# Patient Record
Sex: Female | Born: 1965 | Race: Black or African American | Marital: Single | Smoking: Current every day smoker
Health system: Southern US, Community
[De-identification: ages and names within clinical notes are randomized; demographics above are authoritative.]

## PROBLEM LIST (undated history)

## (undated) DIAGNOSIS — D219 Benign neoplasm of connective and other soft tissue, unspecified: Secondary | ICD-10-CM

## (undated) DIAGNOSIS — F329 Major depressive disorder, single episode, unspecified: Secondary | ICD-10-CM

## (undated) DIAGNOSIS — R011 Cardiac murmur, unspecified: Secondary | ICD-10-CM

## (undated) DIAGNOSIS — J45909 Unspecified asthma, uncomplicated: Secondary | ICD-10-CM

## (undated) DIAGNOSIS — F32A Depression, unspecified: Secondary | ICD-10-CM

## (undated) DIAGNOSIS — F319 Bipolar disorder, unspecified: Secondary | ICD-10-CM

## (undated) DIAGNOSIS — M26609 Unspecified temporomandibular joint disorder, unspecified side: Secondary | ICD-10-CM

## (undated) DIAGNOSIS — N809 Endometriosis, unspecified: Secondary | ICD-10-CM

## (undated) DIAGNOSIS — A6000 Herpesviral infection of urogenital system, unspecified: Secondary | ICD-10-CM

## (undated) HISTORY — DX: Major depressive disorder, single episode, unspecified: F32.9

## (undated) HISTORY — DX: Bipolar disorder, unspecified: F31.9

## (undated) HISTORY — DX: Depression, unspecified: F32.A

## (undated) HISTORY — DX: Herpesviral infection of urogenital system, unspecified: A60.00

## (undated) HISTORY — DX: Unspecified asthma, uncomplicated: J45.909

## (undated) HISTORY — DX: Unspecified temporomandibular joint disorder, unspecified side: M26.609

## (undated) HISTORY — DX: Benign neoplasm of connective and other soft tissue, unspecified: D21.9

## (undated) HISTORY — PX: OVARIAN CYST SURGERY: SHX726

## (undated) HISTORY — DX: Cardiac murmur, unspecified: R01.1

## (undated) HISTORY — DX: Endometriosis, unspecified: N80.9

---

## 1992-07-24 HISTORY — PX: TUBAL LIGATION: SHX77

## 2007-01-25 DIAGNOSIS — A6000 Herpesviral infection of urogenital system, unspecified: Secondary | ICD-10-CM

## 2007-01-25 HISTORY — DX: Herpesviral infection of urogenital system, unspecified: A60.00

## 2012-06-27 ENCOUNTER — Encounter: Payer: Self-pay | Admitting: Obstetrics

## 2012-06-27 ENCOUNTER — Ambulatory Visit (INDEPENDENT_AMBULATORY_CARE_PROVIDER_SITE_OTHER): Payer: Medicaid Other | Admitting: Obstetrics

## 2012-06-27 VITALS — BP 137/89 | HR 81 | Temp 98.9°F | Ht <= 58 in | Wt 168.0 lb

## 2012-06-27 DIAGNOSIS — N760 Acute vaginitis: Secondary | ICD-10-CM

## 2012-06-27 DIAGNOSIS — Z01419 Encounter for gynecological examination (general) (routine) without abnormal findings: Secondary | ICD-10-CM

## 2012-06-27 DIAGNOSIS — N949 Unspecified condition associated with female genital organs and menstrual cycle: Secondary | ICD-10-CM

## 2012-06-27 DIAGNOSIS — Z0000 Encounter for general adult medical examination without abnormal findings: Secondary | ICD-10-CM

## 2012-06-27 DIAGNOSIS — R52 Pain, unspecified: Secondary | ICD-10-CM | POA: Insufficient documentation

## 2012-06-27 DIAGNOSIS — Z113 Encounter for screening for infections with a predominantly sexual mode of transmission: Secondary | ICD-10-CM

## 2012-06-27 DIAGNOSIS — F329 Major depressive disorder, single episode, unspecified: Secondary | ICD-10-CM | POA: Insufficient documentation

## 2012-06-27 LAB — COMPREHENSIVE METABOLIC PANEL
CO2: 25 mEq/L (ref 19–32)
Calcium: 9.3 mg/dL (ref 8.4–10.5)
Chloride: 104 mEq/L (ref 96–112)
Creat: 0.64 mg/dL (ref 0.50–1.10)
Glucose, Bld: 80 mg/dL (ref 70–99)
Total Bilirubin: 0.5 mg/dL (ref 0.3–1.2)

## 2012-06-27 LAB — DIFFERENTIAL
Eosinophils Absolute: 0 10*3/uL (ref 0.0–0.7)
Eosinophils Relative: 0 % (ref 0–5)
Lymphocytes Relative: 44 % (ref 12–46)
Lymphs Abs: 3.4 10*3/uL (ref 0.7–4.0)
Monocytes Absolute: 0.5 10*3/uL (ref 0.1–1.0)
Monocytes Relative: 6 % (ref 3–12)

## 2012-06-27 LAB — CBC
HCT: 38.5 % (ref 36.0–46.0)
Hemoglobin: 12.4 g/dL (ref 12.0–15.0)
MCH: 28.5 pg (ref 26.0–34.0)
MCV: 88.5 fL (ref 78.0–100.0)
RBC: 4.35 MIL/uL (ref 3.87–5.11)

## 2012-06-27 LAB — TSH: TSH: 0.694 u[IU]/mL (ref 0.350–4.500)

## 2012-06-27 NOTE — Addendum Note (Signed)
Addended by: Julaine Hua on: 06/27/2012 05:44 PM   Modules accepted: Orders

## 2012-06-27 NOTE — Progress Notes (Signed)
Subjective:     Mariah Meyer is a 47 y.o. female here for a routine exam.  Current complaints: lower abdominal pain.     Gynecologic History Patient's last menstrual period was 06/06/2012. Contraception: tubal ligation Last Pap: 08/2010. Results were: normal Last mammogram: n/a. Results were: n/a    The following portions of the patient's history were reviewed and updated as appropriate: allergies, current medications, past family history, past medical history, past social history, past surgical history and problem list.  Review of Systems Pertinent items are noted in HPI.    Objective:    General appearance: alert and no distress Breasts: normal appearance, no masses or tenderness Abdomen: normal findings: soft, non-tender Pelvic: cervix normal in appearance, external genitalia normal, no adnexal masses or tenderness, no cervical motion tenderness, uterus normal size, shape, and consistency and vagina normal without discharge    Assessment:      Chronic pain syndrome.  Doubt endometriosis.  Referred to IM  Mammogram and pelvic U/S ordered.  STD Panel and General Health Panel ordered.   Plan:    Education reviewed: depression evaluation, low fat, low cholesterol diet, safe sex/STD prevention, self breast exams and chronic pain..   F/U in 2 weeks.

## 2012-06-28 LAB — GC/CHLAMYDIA PROBE AMP: CT Probe RNA: NEGATIVE

## 2012-06-28 LAB — HEPATITIS B SURFACE ANTIGEN: Hepatitis B Surface Ag: NEGATIVE

## 2012-06-28 LAB — HIV ANTIBODY (ROUTINE TESTING W REFLEX): HIV: NONREACTIVE

## 2012-06-28 LAB — HEPATITIS C ANTIBODY: HCV Ab: NEGATIVE

## 2012-06-28 LAB — WET PREP BY MOLECULAR PROBE
Candida species: NEGATIVE
Gardnerella vaginalis: POSITIVE — AB

## 2012-07-03 ENCOUNTER — Telehealth: Payer: Self-pay | Admitting: *Deleted

## 2012-07-03 ENCOUNTER — Ambulatory Visit (HOSPITAL_COMMUNITY)
Admission: RE | Admit: 2012-07-03 | Discharge: 2012-07-03 | Disposition: A | Discharge status: Home / Self Care | Payer: Medicaid Other | Source: Ambulatory Visit | Attending: Obstetrics | Admitting: Obstetrics

## 2012-07-03 DIAGNOSIS — Z113 Encounter for screening for infections with a predominantly sexual mode of transmission: Secondary | ICD-10-CM

## 2012-07-03 DIAGNOSIS — Z1231 Encounter for screening mammogram for malignant neoplasm of breast: Secondary | ICD-10-CM

## 2012-07-03 MED ORDER — METRONIDAZOLE 500 MG PO TABS: ORAL_TABLET | ORAL | Status: AC

## 2012-07-03 NOTE — Telephone Encounter (Signed)
Patient returning phone call regarding lab results. She was informed per Dr Clearance Coots of Rx for Flagyl 2 grams p o all at once, then Flagyl 500mg  p o bid x 7 days. Patient verbalized understanding and agrees as instructed.

## 2012-07-04 ENCOUNTER — Other Ambulatory Visit: Payer: Self-pay | Admitting: Obstetrics

## 2012-07-04 ENCOUNTER — Ambulatory Visit (INDEPENDENT_AMBULATORY_CARE_PROVIDER_SITE_OTHER): Payer: Medicaid Other

## 2012-07-04 DIAGNOSIS — N949 Unspecified condition associated with female genital organs and menstrual cycle: Secondary | ICD-10-CM

## 2012-07-16 ENCOUNTER — Encounter: Payer: Self-pay | Admitting: Obstetrics

## 2012-07-17 ENCOUNTER — Encounter: Payer: Self-pay | Admitting: Obstetrics

## 2012-07-17 ENCOUNTER — Ambulatory Visit (INDEPENDENT_AMBULATORY_CARE_PROVIDER_SITE_OTHER): Payer: Medicaid Other | Admitting: Obstetrics

## 2012-07-17 VITALS — BP 101/67 | HR 62 | Temp 98.0°F | Wt 167.8 lb

## 2012-07-17 DIAGNOSIS — B9689 Other specified bacterial agents as the cause of diseases classified elsewhere: Secondary | ICD-10-CM

## 2012-07-17 DIAGNOSIS — N760 Acute vaginitis: Secondary | ICD-10-CM

## 2012-07-17 DIAGNOSIS — A499 Bacterial infection, unspecified: Secondary | ICD-10-CM

## 2012-07-17 MED ORDER — METRONIDAZOLE 0.75 % VA GEL: 1.0000 | Freq: Two times a day (BID) | VAGINAL | Status: DC

## 2012-07-17 NOTE — Progress Notes (Signed)
Subjective:     Mariah Meyer is a 47 y.o. female here for a follow up to discuss ultrasound results.  Current complaints: pelvic pain.  Personal health questionnaire reviewed: not asked.   Gynecologic History Patient's last menstrual period was 07/07/2012. Contraception: not asked    The following portions of the patient's history were reviewed and updated as appropriate: allergies, current medications, past family history, past medical history, past social history, past surgical history and problem list.  Review of Systems Pertinent items are noted in HPI.    Objective:    General appearance: alert and no distress    No exam done.  Consult only.  Assessment:    Pelvic pain.  Ultrasound WNL's.  Pain probably not Gyn source.     Plan:    Education reviewed: Pelvic pain. F/U prn.

## 2012-07-18 ENCOUNTER — Ambulatory Visit: Payer: Medicaid Other | Admitting: Obstetrics

## 2012-11-02 ENCOUNTER — Ambulatory Visit: Payer: Medicaid Other | Attending: Internal Medicine | Admitting: Internal Medicine

## 2012-11-02 VITALS — BP 126/89 | HR 72 | Temp 97.9°F | Resp 18 | Wt 174.0 lb

## 2012-11-02 DIAGNOSIS — R42 Dizziness and giddiness: Secondary | ICD-10-CM

## 2012-11-02 DIAGNOSIS — R51 Headache: Secondary | ICD-10-CM | POA: Insufficient documentation

## 2012-11-02 DIAGNOSIS — Z09 Encounter for follow-up examination after completed treatment for conditions other than malignant neoplasm: Secondary | ICD-10-CM | POA: Insufficient documentation

## 2012-11-02 MED ORDER — ETODOLAC 500 MG PO TABS: 500.0000 mg | ORAL_TABLET | Freq: Two times a day (BID) | ORAL | Status: AC

## 2012-11-02 MED ORDER — DULOXETINE HCL 30 MG PO CPEP: 30.0000 mg | ORAL_CAPSULE | Freq: Every day | ORAL | Status: DC

## 2012-11-02 MED ORDER — ARIPIPRAZOLE 5 MG PO TABS: 5.0000 mg | ORAL_TABLET | Freq: Every day | ORAL | Status: AC

## 2012-11-02 MED ORDER — ALBUTEROL SULFATE HFA 108 (90 BASE) MCG/ACT IN AERS: 2.0000 | INHALATION_SPRAY | Freq: Four times a day (QID) | RESPIRATORY_TRACT | Status: DC | PRN

## 2012-11-02 MED ORDER — LORAZEPAM 1 MG PO TABS: 1.0000 mg | ORAL_TABLET | Freq: Two times a day (BID) | ORAL | Status: DC | PRN

## 2012-11-02 MED ORDER — FERROUS SULFATE CR 160 (50 FE) MG PO TBCR: 160.0000 mg | EXTENDED_RELEASE_TABLET | Freq: Two times a day (BID) | ORAL | Status: DC

## 2012-11-02 MED ORDER — OXYCODONE-ACETAMINOPHEN 5-325 MG PO TABS: 1.0000 | ORAL_TABLET | Freq: Three times a day (TID) | ORAL | Status: DC | PRN

## 2012-11-02 MED ORDER — FLUTICASONE PROPIONATE HFA 110 MCG/ACT IN AERO: 1.0000 | INHALATION_SPRAY | Freq: Two times a day (BID) | RESPIRATORY_TRACT | Status: DC

## 2012-11-02 NOTE — Patient Instructions (Signed)
Neuropathy Neuropathy means your peripheral nerves are not working normally. Peripheral nerves are the nerves outside the brain and spinal cord. Messages between the brain and the rest of the body do not work properly with peripheral nerve disorders. CAUSES There are many different causes of peripheral nerve disorders. These include:  Injury.   Infections.   Diabetes.   Vitamin deficiency.   Poor circulation.   Alcoholism.   Exposure to toxins.   Drug effects.   Tumors.   Kidney disease.  SYMPTOMS  Tingling, burning, pain, and numbness in the extremities.   Weakness and loss of muscle tone and size.  DIAGNOSIS Blood tests and special studies of nerve function may help confirm the diagnosis.  TREATMENT  Treatment includes adopting healthy life habits.   A good diet, vitamin supplements, and mild pain medicine may be needed.   Avoid known toxins such as alcohol, tobacco, and recreational drugs.   Anti-convulsant medicines are helpful in some types of neuropathy.  Make a follow-up appointment with your caregiver to be sure you are getting better with treatment.  SEEK IMMEDIATE MEDICAL CARE IF:   You have breathing problems.   You have severe or uncontrolled pain.   You notice extreme weakness or you feel faint.   You are not better after 1 week or if you have worse symptoms.  Document Released: 02/18/2004 Document Revised: 09/22/2010 Document Reviewed: 01/10/2005 [employer] Patient Information 2012 [employer]. 

## 2012-11-02 NOTE — Progress Notes (Signed)
Patient here to establish care History of asthma Depression Back problems

## 2012-11-02 NOTE — Progress Notes (Signed)
Patient ID: Mariah Meyer, female   DOB: 03-Oct-1965, 47 y.o.   MRN: [id number]  CC:  Followup and refill of medications   HPI: Patient is 47 year old female who presents to clinic with multiple concerns including chronic headaches, muscle spasms that are intermittent and worse at night associated with muscle cramping, lower back pain that striving and constant, 5/10 in severity, radiating to left lower extremity associated with toe cramping and inability to ambulate on certain occasions. She explains her symptoms get worse with menstrual cycle and improve 1 weeks post menstrual cycle. She has had symptoms for over several years and is not sure where it's all coming from. She denies fevers and chills, no specific focal neurological deficits, no weight loss or weight gain, no night sweats.   Allergies  Allergen Reactions  . Percocet [Oxycodone-Acetaminophen] Itching  . Risperidone And Related     Lactation   . Sulfa Antibiotics Itching   Past Medical History  Diagnosis Date  . Asthma   . Endometriosis   . Depression   . Fibroid   . TMJ (temporomandibular joint disorder)   . Genital herpes 2009  . Heart murmur   . Bipolar disorder, unspecified    Current Outpatient Prescriptions on File Prior to Visit  Medication Sig Dispense Refill  . clotrimazole-betamethasone (LOTRISONE) cream Apply 1 application topically 2 (two) times daily.      . metroNIDAZOLE (METROGEL VAGINAL) 0.75 % vaginal gel Place 1 Applicatorful vaginally 2 (two) times daily.  70 g  2  . omeprazole (PRILOSEC) 40 MG capsule Take 40 mg by mouth daily.      . potassium chloride (K-DUR) 10 MEQ tablet Take 10 mEq by mouth 2 (two) times daily.      Marland Kitchen tiZANidine (ZANAFLEX) 4 MG capsule Take 4 mg by mouth 3 (three) times daily as needed for muscle spasms.       No current facility-administered medications on file prior to visit.   Family History  Problem Relation Age of Onset  . Alcoholism Mother   . Arthritis    . Asthma Mother    . Cancer    . Chronic bronchitis    . Diabetes    . Emphysema    . HIV Mother   . Depression Mother   . Migraines    . Breast cancer Maternal Aunt    History   Social History  . Marital Status: Single    Spouse Name: N/A    Number of Children: 4  . Years of Education: N/A   Occupational History  . Not on file.   Social History Main Topics  . Smoking status: Current Every Day Smoker -- 0.50 packs/day    Types: Cigarettes  . Smokeless tobacco: Never Used  . Alcohol Use: Yes     Comment: daily  . Drug Use: Yes    Special: Marijuana     Comment: "daily to eat"  . Sexual Activity: Yes    Partners: Female    Pharmacist, hospital Protection: Surgical   Other Topics Concern  . Not on file   Social History Narrative  . No narrative on file    Review of Systems  Constitutional: Negative for fever, chills, diaphoresis, activity change, appetite change and fatigue.  HENT: Negative for ear pain, nosebleeds, congestion, facial swelling, rhinorrhea, neck pain, neck stiffness and ear discharge.   Eyes: Negative for pain, discharge, redness, itching and visual disturbance.  Respiratory: Negative for cough, choking, chest tightness, shortness of breath, wheezing and stridor.  Cardiovascular: Negative for chest pain, palpitations and leg swelling.  Gastrointestinal: Negative for abdominal distention.  Genitourinary: Negative for dysuria, urgency, frequency, hematuria, flank pain, decreased urine volume, difficulty urinating and dyspareunia.  Musculoskeletal: per history of present illness  Neurological: per history of present illness  Hematological: Negative for adenopathy. Does not bruise/bleed easily.  Psychiatric/Behavioral: Negative for hallucinations, behavioral problems, confusion, dysphoric mood, decreased concentration and agitation.    Objective:   Filed Vitals:   11/02/12 0907  BP: 126/89  Pulse: 72  Temp: 97.9 F (36.6 C)  Resp: 18    Physical Exam   Constitutional: Appears well-developed and well-nourished. No distress.  HENT: Normocephalic. External right and left ear normal. Oropharynx is clear and moist.  Eyes: Conjunctivae and EOM are normal. PERRLA, no scleral icterus.  Neck: Normal ROM. Neck supple. No JVD. No tracheal deviation. No thyromegaly.  CVS: RRR, S1/S2 +, no murmurs, no gallops, no carotid bruit.  Pulmonary: Effort and breath sounds normal, no stridor, rhonchi, wheezes, rales.  Abdominal: Soft. BS +,  no distension, tenderness, rebound or guarding.  Musculoskeletal: Normal range of motion. No edema and no tenderness.  Lymphadenopathy: No lymphadenopathy noted, cervical, inguinal. Neuro: Alert. Normal reflexes, muscle tone coordination. No cranial nerve deficit. Skin: Skin is warm and dry. No rash noted. Not diaphoretic. No erythema. No pallor.  Psychiatric: Normal mood and affect. Behavior, judgment, thought content normal.   Lab Results  Component Value Date   WBC 7.8 06/27/2012   HGB 12.4 06/27/2012   HCT 38.5 06/27/2012   MCV 88.5 06/27/2012   PLT 288 06/27/2012   Lab Results  Component Value Date   CREATININE 0.64 06/27/2012   BUN 9 06/27/2012   NA 139 06/27/2012   K 3.4* 06/27/2012   CL 104 06/27/2012   CO2 25 06/27/2012    No results found for this basename: HGBA1C   Lipid Panel  No results found for this basename: chol, trig, hdl, cholhdl, vldl, ldlcalc       Assessment and plan:   Patient Active Problem List   Diagnosis Date Noted  . Pain - continuing analgesia, encouraged physical therapy. Also encouraged applying heating pads to the areas affected  06/27/2012  .  muscle spasm, chronic headaches, dizziness - patient experiencing wide variety of symptoms over a long period of time and it is difficult to continue to several different diagnosis to explain the symptoms. We have discussed other diagnoses including possibility of multiple sclerosis. Patient did acknowledge that several family members were affected by MS  but she is not sure of the exact diagnosis. I will provide referral to neurology for further evaluation.  06/27/2012

## 2012-11-05 ENCOUNTER — Telehealth: Payer: Self-pay | Admitting: Internal Medicine

## 2012-11-05 NOTE — Telephone Encounter (Signed)
Pt calling with medication effect update: LORazepam (ATIVAN) 1 MG tablet is helping with numbness in face and hand/foot but not helping with menstrual cramps or pain.  Please f/u with pt.

## 2012-11-05 NOTE — Telephone Encounter (Signed)
Spoke with pt and told her to try OTC Pamprin for cramping if not resolved call to schedule office visit.

## 2012-11-07 ENCOUNTER — Telehealth: Payer: Self-pay | Admitting: Emergency Medicine

## 2012-11-16 ENCOUNTER — Ambulatory Visit: Payer: Medicaid Other | Attending: Internal Medicine | Admitting: Internal Medicine

## 2012-11-16 ENCOUNTER — Encounter: Payer: Self-pay | Admitting: Internal Medicine

## 2012-11-16 VITALS — BP 127/79 | HR 60 | Temp 98.1°F | Resp 16 | Wt 178.0 lb

## 2012-11-16 DIAGNOSIS — F329 Major depressive disorder, single episode, unspecified: Secondary | ICD-10-CM

## 2012-11-16 DIAGNOSIS — F411 Generalized anxiety disorder: Secondary | ICD-10-CM | POA: Insufficient documentation

## 2012-11-16 DIAGNOSIS — R52 Pain, unspecified: Secondary | ICD-10-CM

## 2012-11-16 MED ORDER — OMEPRAZOLE 40 MG PO CPDR: 40.0000 mg | DELAYED_RELEASE_CAPSULE | Freq: Every day | ORAL | Status: DC

## 2012-11-16 MED ORDER — OXYCODONE-ACETAMINOPHEN 5-325 MG PO TABS: 1.0000 | ORAL_TABLET | Freq: Three times a day (TID) | ORAL | Status: DC | PRN

## 2012-11-16 MED ORDER — FERROUS SULFATE CR 160 (50 FE) MG PO TBCR: 160.0000 mg | EXTENDED_RELEASE_TABLET | Freq: Two times a day (BID) | ORAL | Status: AC

## 2012-11-16 MED ORDER — TRIAMCINOLONE ACETONIDE 0.1 % EX CREA: TOPICAL_CREAM | Freq: Two times a day (BID) | CUTANEOUS | Status: AC

## 2012-11-16 MED ORDER — POTASSIUM CHLORIDE ER 10 MEQ PO TBCR: 10.0000 meq | EXTENDED_RELEASE_TABLET | Freq: Two times a day (BID) | ORAL | Status: DC

## 2012-11-16 MED ORDER — LORAZEPAM 1 MG PO TABS: 1.0000 mg | ORAL_TABLET | Freq: Two times a day (BID) | ORAL | Status: AC | PRN

## 2012-11-16 NOTE — Progress Notes (Signed)
Pt is here for a f/u and needing refills on meds Voices no new concerns Alert w/no signs of acute distress.

## 2012-11-16 NOTE — Progress Notes (Signed)
Patient ID: Mariah Meyer, female   DOB: [birth year], 47 y.o.   MRN: [id number]  CC: Followup  HPI: 47 year old female with past medical history of anxiety, depression, neuropathy who presented to clinic for followup. Patient reports that her tingling in extremities was significantly relieved with Ativan. Patient has no chest pain, shortness of breath.  Allergies  Allergen Reactions  . Percocet [Oxycodone-Acetaminophen] Itching  . Risperidone And Related     Lactation   . Sulfa Antibiotics Itching   Past Medical History  Diagnosis Date  . Asthma   . Endometriosis   . Depression   . Fibroid   . TMJ (temporomandibular joint disorder)   . Genital herpes 2009  . Heart murmur   . Bipolar disorder, unspecified    Current Outpatient Prescriptions on File Prior to Visit  Medication Sig Dispense Refill  . albuterol (PROVENTIL HFA;VENTOLIN HFA) 108 (90 BASE) MCG/ACT inhaler Inhale 2 puffs into the lungs every 6 (six) hours as needed for wheezing.  1 Inhaler  11  . cholecalciferol (VITAMIN D) 1000 UNITS tablet Take 1,000 Units by mouth daily.      . clotrimazole-betamethasone (LOTRISONE) cream Apply 1 application topically 2 (two) times daily.      . fluticasone (FLOVENT HFA) 110 MCG/ACT inhaler Inhale 1 puff into the lungs 2 (two) times daily.  1 Inhaler  5  . tiZANidine (ZANAFLEX) 4 MG capsule Take 4 mg by mouth 3 (three) times daily as needed for muscle spasms.      . ARIPiprazole (ABILIFY) 5 MG tablet Take 1 tablet (5 mg total) by mouth daily.  30 tablet  1  . etodolac (LODINE) 500 MG tablet Take 1 tablet (500 mg total) by mouth 2 (two) times daily.  60 tablet  3   No current facility-administered medications on file prior to visit.   Family History  Problem Relation Age of Onset  . Alcoholism Mother   . Arthritis    . Asthma Mother   . Cancer    . Chronic bronchitis    . Diabetes    . Emphysema    . HIV Mother   . Depression Mother   . Migraines    . Breast cancer Maternal Aunt     History   Social History  . Marital Status: Single    Spouse Name: N/A    Number of Children: 4  . Years of Education: N/A   Occupational History  . Not on file.   Social History Main Topics  . Smoking status: Current Every Day Smoker -- 0.50 packs/day    Types: Cigarettes  . Smokeless tobacco: Never Used  . Alcohol Use: Yes     Comment: daily  . Drug Use: Yes    Special: Marijuana     Comment: "daily to eat"  . Sexual Activity: Yes    Partners: Female    Pharmacist, hospital Protection: Surgical   Other Topics Concern  . Not on file   Social History Narrative  . No narrative on file    Review of Systems  Constitutional: Negative for fever, chills, diaphoresis, activity change, appetite change and fatigue.  HENT: Negative for ear pain, nosebleeds, congestion, facial swelling, rhinorrhea, neck pain, neck stiffness and ear discharge.   Eyes: Negative for pain, discharge, redness, itching and visual disturbance.  Respiratory: Negative for cough, choking, chest tightness, shortness of breath, wheezing and stridor.   Cardiovascular: Negative for chest pain, palpitations and leg swelling.  Gastrointestinal: Negative for abdominal distention.  Genitourinary: Negative  for dysuria, urgency, frequency, hematuria, flank pain, decreased urine volume, difficulty urinating and dyspareunia.  Musculoskeletal: Negative for back pain, joint swelling, arthralgias and gait problem.  Neurological: Negative for dizziness, tremors, seizures, syncope, facial asymmetry, speech difficulty, weakness, light-headedness, numbness and headaches.  Hematological: Negative for adenopathy. Does not bruise/bleed easily.  Psychiatric/Behavioral: Negative for hallucinations, behavioral problems, confusion, dysphoric mood, decreased concentration and agitation.    Objective:   Filed Vitals:   11/16/12 1116  BP: 127/79  Pulse: 60  Temp: 98.1 F (36.7 C)  Resp: 16    Physical Exam  Constitutional:  Appears well-developed and well-nourished. No distress.  HENT: Normocephalic. External right and left ear normal. Oropharynx is clear and moist.  Eyes: Conjunctivae and EOM are normal. PERRLA, no scleral icterus.  Neck: Normal ROM. Neck supple. No JVD. No tracheal deviation. No thyromegaly.  CVS: RRR, S1/S2 +, no murmurs, no gallops, no carotid bruit.  Pulmonary: Effort and breath sounds normal, no stridor, rhonchi, wheezes, rales.  Abdominal: Soft. BS +,  no distension, tenderness, rebound or guarding.  Musculoskeletal: Normal range of motion. No edema and no tenderness.  Lymphadenopathy: No lymphadenopathy noted, cervical, inguinal. Neuro: Alert. Normal reflexes, muscle tone coordination. No cranial nerve deficit. Skin: Skin is warm and dry. No rash noted. Not diaphoretic. No erythema. No pallor.  Psychiatric: Normal mood and affect. Behavior, judgment, thought content normal.   Lab Results  Component Value Date   WBC 7.8 06/27/2012   HGB 12.4 06/27/2012   HCT 38.5 06/27/2012   MCV 88.5 06/27/2012   PLT 288 06/27/2012   Lab Results  Component Value Date   CREATININE 0.64 06/27/2012   BUN 9 06/27/2012   NA 139 06/27/2012   K 3.4* 06/27/2012   CL 104 06/27/2012   CO2 25 06/27/2012    No results found for this basename: HGBA1C   Lipid Panel  No results found for this basename: chol, trig, hdl, cholhdl, vldl, ldlcalc       Assessment and plan:   Patient Active Problem List   Diagnosis Date Noted  . Anxiety state, unspecified 11/16/2012    Priority: Medium - Continue Ativan 1 mg every 8 hours as needed   . Pain 06/27/2012    Priority: Medium - Continue Roxicet as needed every 4-6 hours. Referral provided to pain management clinic   . Depression 06/27/2012    Priority: Medium - Continue Abilify

## 2012-11-16 NOTE — Patient Instructions (Signed)
Anxiety and Panic Attacks Your caregiver has informed you that you are having an anxiety or panic attack. There may be many forms of this. Most of the time these attacks come suddenly and without warning. They come at any time of day, including periods of sleep, and at any time of life. They may be strong and unexplained. Although panic attacks are very scary, they are physically harmless. Sometimes the cause of your anxiety is not known. Anxiety is a protective mechanism of the body in its fight or flight mechanism. Most of these perceived danger situations are actually nonphysical situations (such as anxiety over losing a job). CAUSES  The causes of an anxiety or panic attack are many. Panic attacks may occur in otherwise healthy people given a certain set of circumstances. There may be a genetic cause for panic attacks. Some medications may also have anxiety as a side effect. SYMPTOMS  Some of the most common feelings are:  Intense terror.  Dizziness, feeling faint.  Hot and cold flashes.  Fear of going crazy.  Feelings that nothing is real.  Sweating.  Shaking.  Chest pain or a fast heartbeat (palpitations).  Smothering, choking sensations.  Feelings of impending doom and that death is near.  Tingling of extremities, this may be from over-breathing.  Altered reality (derealization).  Being detached from yourself (depersonalization). Several symptoms can be present to make up anxiety or panic attacks. DIAGNOSIS  The evaluation by your caregiver will depend on the type of symptoms you are experiencing. The diagnosis of anxiety or panic attack is made when no physical illness can be determined to be a cause of the symptoms. TREATMENT  Treatment to prevent anxiety and panic attacks may include:  Avoidance of circumstances that cause anxiety.  Reassurance and relaxation.  Regular exercise.  Relaxation therapies, such as yoga.  Psychotherapy with a psychiatrist or  therapist.  Avoidance of caffeine, alcohol and illegal drugs.  Prescribed medication. SEEK IMMEDIATE MEDICAL CARE IF:   You experience panic attack symptoms that are different than your usual symptoms.  You have any worsening or concerning symptoms. Document Released: 01/10/2005 Document Revised: 04/04/2011 Document Reviewed: 05/14/2009 [facility] Patient Information 2014 [place], [place]. Depression, Adult Depression refers to feeling sad, low, down in the dumps, blue, gloomy, or empty. In general, there are two kinds of depression: 1. Depression that we all experience from time to time because of upsetting life experiences, including the loss of a job or the ending of a relationship (normal sadness or normal grief). This kind of depression is considered normal, is short lived, and resolves within a few days to 2 weeks. (Depression experienced after the loss of a loved one is called bereavement. Bereavement often lasts longer than 2 weeks but normally gets better with time.) 2. Clinical depression, which lasts longer than normal sadness or normal grief or interferes with your ability to function at home, at work, and in school. It also interferes with your personal relationships. It affects almost every aspect of your life. Clinical depression is an illness. Symptoms of depression also can be caused by conditions other than normal sadness and grief or clinical depression. Examples of these conditions are listed as follows:  Physical illness Some physical illnesses, including underactive thyroid gland (hypothyroidism), severe anemia, specific types of cancer, diabetes, uncontrolled seizures, heart and lung problems, strokes, and chronic pain are commonly associated with symptoms of depression.  Side effects of some prescription medicine In some people, certain types of prescription medicine can cause symptoms  of depression.  Substance abuse Abuse of alcohol and illicit drugs can cause symptoms of  depression. SYMPTOMS Symptoms of normal sadness and normal grief include the following:  Feeling sad or crying for short periods of time.  Not caring about anything (apathy).  Difficulty sleeping or sleeping too much.  No longer able to enjoy the things you used to enjoy.  Desire to be by oneself all the time (social isolation).  Lack of energy or motivation.  Difficulty concentrating or remembering.  Change in appetite or weight.  Restlessness or agitation. Symptoms of clinical depression include the same symptoms of normal sadness or normal grief and also the following symptoms:  Feeling sad or crying all the time.  Feelings of guilt or worthlessness.  Feelings of hopelessness or helplessness.  Thoughts of suicide or the desire to harm yourself (suicidal ideation).  Loss of touch with reality (psychotic symptoms). Seeing or hearing things that are not real (hallucinations) or having false beliefs about your life or the people around you (delusions and paranoia). DIAGNOSIS  The diagnosis of clinical depression usually is based on the severity and duration of the symptoms. Your caregiver also will ask you questions about your medical history and substance use to find out if physical illness, use of prescription medicine, or substance abuse is causing your depression. Your caregiver also may order blood tests. TREATMENT  Typically, normal sadness and normal grief do not require treatment. However, sometimes antidepressant medicine is prescribed for bereavement to ease the depressive symptoms until they resolve. The treatment for clinical depression depends on the severity of your symptoms but typically includes antidepressant medicine, counseling with a mental health professional, or a combination of both. Your caregiver will help to determine what treatment is best for you. Depression caused by physical illness usually goes away with appropriate medical treatment of the illness.  If prescription medicine is causing depression, talk with your caregiver about stopping the medicine, decreasing the dose, or substituting another medicine. Depression caused by abuse of alcohol or illicit drugs abuse goes away with abstinence from these substances. Some adults need professional help in order to stop drinking or using drugs. SEEK IMMEDIATE CARE IF:  You have thoughts about hurting yourself or others.  You lose touch with reality (have psychotic symptoms).  You are taking medicine for depression and have a serious side effect. FOR MORE INFORMATION National [employer] on Mental Illness: www.nami.[employer]: http://www.maynard.net/ Document Released: 01/08/2000 Document Revised: 07/12/2011 Document Reviewed: 04/11/2011 [facility] Patient Information 2014 [place], [place].

## 2012-11-20 ENCOUNTER — Telehealth: Payer: Self-pay | Admitting: Internal Medicine

## 2012-11-20 NOTE — Telephone Encounter (Signed)
Pt not having tingling in left leg but now left big toe hurts, especially at night.  At night whole leg and foot get really cold, left big toe is numb to touch.   Pt is worried and says it is uncomfortable at work.  Please f/u with pt.

## 2012-11-20 NOTE — Telephone Encounter (Signed)
Pt given appt to come in tomorrow morning for labs per Dr. Izola Price

## 2012-11-21 ENCOUNTER — Ambulatory Visit: Payer: Medicaid Other | Attending: Internal Medicine

## 2012-11-21 VITALS — BP 124/82 | HR 65 | Temp 98.5°F | Resp 16

## 2012-11-21 DIAGNOSIS — Z299 Encounter for prophylactic measures, unspecified: Secondary | ICD-10-CM

## 2012-11-21 DIAGNOSIS — M79609 Pain in unspecified limb: Secondary | ICD-10-CM | POA: Insufficient documentation

## 2012-11-21 LAB — COMPLETE METABOLIC PANEL WITH GFR
AST: 20 U/L (ref 0–37)
Albumin: 3.8 g/dL (ref 3.5–5.2)
Alkaline Phosphatase: 52 U/L (ref 39–117)
BUN: 10 mg/dL (ref 6–23)
Creat: 0.69 mg/dL (ref 0.50–1.10)
GFR, Est Non African American: >89 mL/min
Glucose, Bld: 91 mg/dL (ref 70–99)
Total Bilirubin: 0.4 mg/dL (ref 0.3–1.2)

## 2012-11-21 LAB — CBC WITH DIFFERENTIAL/PLATELET
Basophils Absolute: 0 10*3/uL (ref 0.0–0.1)
Basophils Relative: 1 % (ref 0–1)
Eosinophils Absolute: 0.1 10*3/uL (ref 0.0–0.7)
Eosinophils Relative: 1 % (ref 0–5)
MCH: 31.8 pg (ref 26.0–34.0)
MCV: 92.8 fL (ref 78.0–100.0)
Neutro Abs: 3.5 10*3/uL (ref 1.7–7.7)
Platelets: 297 10*3/uL (ref 150–400)
RDW: 15.3 % (ref 11.5–15.5)
WBC: 6.7 10*3/uL (ref 4.0–10.5)

## 2012-11-21 LAB — TSH: TSH: 0.377 u[IU]/mL (ref 0.350–4.500)

## 2012-11-21 LAB — URIC ACID: Uric Acid, Serum: 3.8 mg/dL (ref 2.4–7.0)

## 2012-11-21 NOTE — Progress Notes (Signed)
Pt here for repeat blood work C/o left foot great toe pain with numbness

## 2012-11-23 ENCOUNTER — Telehealth: Payer: Self-pay | Admitting: Internal Medicine

## 2012-11-23 DIAGNOSIS — R200 Anesthesia of skin: Secondary | ICD-10-CM

## 2012-11-23 NOTE — Addendum Note (Signed)
Addended by: Nonnie Done D on: 11/23/2012 02:12 PM   Modules accepted: Orders

## 2012-11-23 NOTE — Telephone Encounter (Signed)
Pt given negative blood work

## 2012-11-23 NOTE — Telephone Encounter (Signed)
Patient called in regards to blood work results from Monday 11/19/12, patient was informed she would get results today.  Patient woke up with a bruise on her upper thighs, she is unaware of the reason for these bruises.  Please call patient as soon as possible, she does not have access to transportation and would have to plan ahead in case she needs to come back to see the doctor.

## 2012-11-23 NOTE — Telephone Encounter (Signed)
Upon reading Dr. Izola Price note from 11/02/12 she did mention neurology referral, but no order placed. Pt c/o continued numbness in feet with hx MS in the family. Order placed

## 2012-11-27 ENCOUNTER — Encounter: Payer: Self-pay | Admitting: Neurology

## 2012-11-27 ENCOUNTER — Ambulatory Visit (INDEPENDENT_AMBULATORY_CARE_PROVIDER_SITE_OTHER): Payer: Medicaid Other | Admitting: Neurology

## 2012-11-27 VITALS — BP 151/95 | HR 67 | Ht <= 58 in | Wt 177.0 lb

## 2012-11-27 DIAGNOSIS — R52 Pain, unspecified: Secondary | ICD-10-CM

## 2012-11-27 DIAGNOSIS — F411 Generalized anxiety disorder: Secondary | ICD-10-CM

## 2012-11-27 DIAGNOSIS — F329 Major depressive disorder, single episode, unspecified: Secondary | ICD-10-CM

## 2012-11-27 NOTE — Progress Notes (Signed)
[facility]  PATIENT: Mariah Meyer DOB: July 07, 1965  HISTORICAL Shirly is a 47 years old right-handed African American female, she is referred by her primary care physician Dr. Lenise Arena for evaluation of constellation of complains  She had a past medical history of depression, anxiety, fibromyalgia, over the past few years, she complains plan called her body, from neck to her feet, she also complains bilateral hip pain, joints pain, she had a history of chronic low back pain, with previous diagnosis of L3 herniated disc, she denies shooting pain to her lower extremity, she denies gait difficulty, she denies bowel and bladder incontinence,  She complains some lightheadedness, some blurry vision, no significant gait difficulty, no permanent sensory loss, left and right symptoms are fairly symmetric.  She also complains of frequent headaches, insomnia, depression, anxiety, not enough sleep, decreased energy, change in appetite  With her constellation of complaints, she worries the possibility of multiple sclerosis, wants to proceed with further testing  REVIEW OF SYSTEMS: Full 14 system review of systems performed and notable only for chill, weight gain, fatigue, palpation, murmur, swelling in legs ringing in ears, spinning sensation, trouble swallowing, blurred vision, double vision, shortness of breath, wheezing, snoring, constipation, anemia, easy bruising,  Increased thirst, flushing, joint pain, cramps, achy muscles, allergy, running nose, skin sensitivities, headaches, numbness, weakness, difficulty swallowing, dizziness, passing out, insomnia, sleepiness, snoring, restless legs, depression, anxiety, not enough sleep, decreased energy, change in appetite, hallucinations, racing thoughts,  ALLERGIES: Allergies  Allergen Reactions  . Percocet [Oxycodone-Acetaminophen] Itching  . Risperidone And Related     Lactation   . Sulfa Antibiotics Itching    HOME  MEDICATIONS: Outpatient Prescriptions Prior to Visit  Medication Sig Dispense Refill  . albuterol (PROVENTIL HFA;VENTOLIN HFA) 108 (90 BASE) MCG/ACT inhaler Inhale 2 puffs into the lungs every 6 (six) hours as needed for wheezing.  1 Inhaler  11  . ARIPiprazole (ABILIFY) 5 MG tablet Take 1 tablet (5 mg total) by mouth daily.  30 tablet  1  . cholecalciferol (VITAMIN D) 1000 UNITS tablet Take 1,000 Units by mouth daily.      Marland Kitchen etodolac (LODINE) 500 MG tablet Take 1 tablet (500 mg total) by mouth 2 (two) times daily.  60 tablet  3  . ferrous sulfate dried (SLOW FE) 160 (50 FE) MG TBCR SR tablet Take 1 tablet (160 mg total) by mouth 2 (two) times daily with a meal.  60 tablet  3  . fluticasone (FLOVENT HFA) 110 MCG/ACT inhaler Inhale 1 puff into the lungs 2 (two) times daily.  1 Inhaler  5  . LORazepam (ATIVAN) 1 MG tablet Take 1 tablet (1 mg total) by mouth 2 (two) times daily as needed for anxiety.  60 tablet  1  . omeprazole (PRILOSEC) 40 MG capsule Take 1 capsule (40 mg total) by mouth daily.  30 capsule  3  . oxyCODONE-acetaminophen (ROXICET) 5-325 MG per tablet Take 1 tablet by mouth every 8 (eight) hours as needed for pain.  65 tablet  0  . potassium chloride (K-DUR) 10 MEQ tablet Take 1 tablet (10 mEq total) by mouth 2 (two) times daily.  60 tablet  3  . tiZANidine (ZANAFLEX) 4 MG capsule Take 4 mg by mouth 3 (three) times daily as needed for muscle spasms.      Marland Kitchen triamcinolone cream (KENALOG) 0.1 % Apply topically 2 (two) times daily.  30 g  3  . clotrimazole-betamethasone (LOTRISONE) cream Apply 1 application topically 2 (two) times  daily.       No facility-administered medications prior to visit.    PAST MEDICAL HISTORY: Past Medical History  Diagnosis Date  . Asthma   . Endometriosis   . Depression   . Fibroid   . TMJ (temporomandibular joint disorder)   . Genital herpes 2009  . Heart murmur   . Bipolar disorder, unspecified     PAST SURGICAL HISTORY: Past Surgical History   Procedure Laterality Date  . Ovarian cyst surgery    . Tubal ligation  07/1992    FAMILY HISTORY: Family History  Problem Relation Age of Onset  . Alcoholism Mother   . Arthritis    . Asthma Mother   . Cancer    . Chronic bronchitis    . Diabetes    . Emphysema    . HIV Mother   . Depression Mother   . Migraines    . Breast cancer Maternal Aunt     SOCIAL HISTORY:  History   Social History  . Marital Status: Single    Spouse Name: N/A    Number of Children: 4  . Years of Education: N/A   Occupational History  .      [employer]   Social History Main Topics  . Smoking status: Current Every Day Smoker -- 0.50 packs/day    Types: Cigarettes  . Smokeless tobacco: Never Used  . Alcohol Use: 0.5 oz/week    1 drink(s) per week     Comment: daily  . Drug Use: Yes    Special: Marijuana     Comment: "daily to eat"  . Sexual Activity: Yes    Partners: Female    Pharmacist, hospital Protection: Surgical   Other Topics Concern  . Not on file   Social History Narrative   Patient lives at home alone.    Patient is single.    Education- some college education.   Patient works at [employer]   Caffeine- Coffee in the morning.   PHYSICAL EXAM   Filed Vitals:   11/27/12 1017  BP: 151/95  Pulse: 67  Height: 4\' 9"  (1.448 m)  Weight: 177 lb (80.287 kg)   Body mass index is 38.29 kg/(m^2).   Generalized: In no acute distress  Neck: Supple, no carotid bruits   Cardiac: Regular rate rhythm  Pulmonary: Clear to auscultation bilaterally  Musculoskeletal: No deformity  Neurological examination  Mentation: Alert oriented to time, place, history taking, and causual conversation  Cranial nerve II-XII: Pupils were equal round reactive to light extraocular movements were full, visual field were full on confrontational test. facial sensation and strength were normal. hearing was intact to finger rubbing bilaterally. Uvula tongue midline.  head  turning and shoulder shrug and were normal and symmetric.Tongue protrusion into cheek strength was normal.  Motor: normal tone, bulk and strength.  Sensory: Intact to fine touch, pinprick, preserved vibratory sensation, and proprioception at toes.  Coordination: Normal finger to nose, heel-to-shin bilaterally there was no truncal ataxia  Gait: Rising up from seated position without assistance, normal stance, without trunk ataxia, moderate stride, good arm swing, smooth turning, able to perform tiptoe, and heel walking without difficulty.   Romberg signs: Negative  Deep tendon reflexes: Brachioradialis 2/2, biceps 2/2, triceps 2/2, patellar 2/2, Achilles 2/2, plantar responses were flexor bilaterally.   DIAGNOSTIC DATA (LABS, IMAGING, TESTING) - I reviewed patient records, labs, notes, testing and imaging myself where available.  Lab Results  Component Value Date   WBC 6.7 11/21/2012  HGB 12.8 11/21/2012   HCT 37.3 11/21/2012   MCV 92.8 11/21/2012   PLT 297 11/21/2012      Component Value Date/Time   NA 138 11/21/2012 0904   K 3.5 11/21/2012 0904   CL 105 11/21/2012 0904   CO2 27 11/21/2012 0904   GLUCOSE 91 11/21/2012 0904   BUN 10 11/21/2012 0904   CREATININE 0.69 11/21/2012 0904   CALCIUM 9.1 11/21/2012 0904   PROT 6.1 11/21/2012 0904   ALBUMIN 3.8 11/21/2012 0904   AST 20 11/21/2012 0904   ALT 15 11/21/2012 0904   ALKPHOS 52 11/21/2012 0904   BILITOT 0.4 11/21/2012 0904    Lab Results  Component Value Date   TSH 0.377 11/21/2012     ASSESSMENT AND PLAN   47 years old right-handed African American female, with past medical history of depression, anxiety, now presenting with constellation of complaints, including diffuse body aching pain, essentially normal neurological examinations.  1.  differentiation diagnosis including fibromyalgia, will proceed with laboratory evaluations, including inflammatory markers, CPK, 2. she also worries the possibility of  multiple sclerosis, we will proceed with MRI of brain 3. EMG nerve conduction study to rule out inflammatory myopathy.     Levert Feinstein, M.D. Ph.D.  [facility] [address] [place], [place] [address] [phone]

## 2012-11-28 LAB — COMPREHENSIVE METABOLIC PANEL
ALT: 13 IU/L (ref 0–32)
AST: 21 IU/L (ref 0–40)
Albumin: 4.4 g/dL (ref 3.5–5.5)
BUN: 11 mg/dL (ref 6–24)
CO2: 22 mmol/L (ref 18–29)
Calcium: 9.6 mg/dL (ref 8.7–10.2)
Chloride: 102 mmol/L (ref 97–108)
Creatinine, Ser: 0.65 mg/dL (ref 0.57–1.00)
Globulin, Total: 2.6 g/dL (ref 1.5–4.5)
Potassium: 4.1 mmol/L (ref 3.5–5.2)
Sodium: 140 mmol/L (ref 134–144)
Total Bilirubin: 0.3 mg/dL (ref 0.0–1.2)

## 2012-11-28 LAB — CK: Total CK: 251 U/L — ABNORMAL HIGH (ref 24–173)

## 2012-11-28 LAB — RPR: RPR: NONREACTIVE

## 2012-11-28 LAB — C-REACTIVE PROTEIN: CRP: 3.6 mg/L (ref 0.0–4.9)

## 2012-11-28 LAB — CBC WITH DIFFERENTIAL
Eos: 1 %
Eosinophils Absolute: 0 10*3/uL (ref 0.0–0.4)
HCT: 40.6 % (ref 34.0–46.6)
Immature Grans (Abs): 0 10*3/uL (ref 0.0–0.1)
Immature Granulocytes: 0 %
Lymphs: 39 %
Monocytes: 9 %
Neutrophils Relative %: 51 %
Platelets: 286 10*3/uL (ref 150–379)
RBC: 4.37 x10E6/uL (ref 3.77–5.28)
WBC: 7.4 10*3/uL (ref 3.4–10.8)

## 2012-11-28 LAB — THYROID PANEL WITH TSH
Free Thyroxine Index: 2 (ref 1.2–4.9)
TSH: 0.43 u[IU]/mL — ABNORMAL LOW (ref 0.450–4.500)

## 2012-11-28 LAB — VITAMIN B12: Vitamin B-12: 940 pg/mL (ref 211–946)

## 2012-11-28 LAB — FOLATE: Folate: 16.3 ng/mL (ref >3.0)

## 2012-11-28 LAB — ANA W/REFLEX IF POSITIVE: Anti Nuclear Antibody(ANA): NEGATIVE

## 2012-11-28 NOTE — Progress Notes (Signed)
Quick Note:  Please call patient, essential normal lab, mild low tsh, elevated CPK of unknown clinical significance ______

## 2012-11-29 NOTE — Progress Notes (Signed)
Quick Note:  Left message for essential normal labs, mild low TSH, and elevated CPK of unknown clinical significance, per Dr. Terrace Arabia. Told to call with further questions. ______

## 2012-12-03 ENCOUNTER — Encounter (INDEPENDENT_AMBULATORY_CARE_PROVIDER_SITE_OTHER): Payer: Self-pay

## 2012-12-03 ENCOUNTER — Telehealth: Payer: Self-pay | Admitting: Emergency Medicine

## 2012-12-03 ENCOUNTER — Ambulatory Visit (INDEPENDENT_AMBULATORY_CARE_PROVIDER_SITE_OTHER): Payer: Medicaid Other | Admitting: Neurology

## 2012-12-03 ENCOUNTER — Telehealth: Payer: Self-pay | Admitting: Neurology

## 2012-12-03 DIAGNOSIS — R52 Pain, unspecified: Secondary | ICD-10-CM

## 2012-12-03 DIAGNOSIS — Z0289 Encounter for other administrative examinations: Secondary | ICD-10-CM

## 2012-12-03 DIAGNOSIS — F329 Major depressive disorder, single episode, unspecified: Secondary | ICD-10-CM

## 2012-12-03 DIAGNOSIS — F411 Generalized anxiety disorder: Secondary | ICD-10-CM

## 2012-12-03 DIAGNOSIS — F32A Depression, unspecified: Secondary | ICD-10-CM

## 2012-12-03 MED ORDER — PREGABALIN 50 MG PO CAPS: 50.0000 mg | ORAL_CAPSULE | Freq: Three times a day (TID) | ORAL | Status: AC

## 2012-12-03 NOTE — Telephone Encounter (Signed)
The patient left message asking if she should come off any of her medications, she didn't say which ones.  Also stated that since she is back at work and on her feet, the Percocet 2 times a day isn't helping.  Please advise.

## 2012-12-03 NOTE — Procedures (Signed)
    [facility]  NCS (NERVE CONDUCTION STUDY) WITH EMG (ELECTROMYOGRAPHY) REPORT   STUDY DATE: 12/03/2012 PATIENT NAME: Mariah Meyer DOB: 06/21/1965 MRN: [id number]    TECHNOLOGIST: Gearldine Shown ELECTROMYOGRAPHER: Levert Feinstein M.D.  CLINICAL INFORMATION:  47 years old [place] American female, complains of diffuse body aching pain for many years, with history of depression anxiety fibromyalgia   On examination; bilateral upper and lower extremity motor strength is normal. Deep tendon reflex was present and symmetric. Sensory was intact to light touch and pinprick.  FINDINGS: NERVE CONDUCTION STUDY: Bilateral peroneal sensory responses were normal. Bilateral peroneal to EDB, and the tibial motor responses were normal. Bilateral tibial H. reflexes were present and symmetric. Left median, ulnar sensory and motor responses were normal  NEEDLE ELECTROMYOGRAPHY: Selected needle examination was performed at right lower extremity muscles, and the right lumbosacral paraspinal muscles.  Needle examination of right tibialis anterior, tibialis posterior, medial gastrocnemius, vastus lateralis, biceps femoris long head was normal.  There was no spontaneous activity at right lumbosacral paraspinal muscles, right L4, L5,  IMPRESSION:  This is a normal study. There is no electrodiagnostic evidence of large fiber peripheral neuropathy, or right lumbosacral radiculopathy.   INTERPRETING PHYSICIAN:   Levert Feinstein M.D. Ph.D. [facility] [address], [address] [place], [place] [address] [phone]

## 2012-12-03 NOTE — Telephone Encounter (Signed)
Will address her questions on her EMG appt.

## 2012-12-04 ENCOUNTER — Telehealth: Payer: Self-pay | Admitting: Neurology

## 2012-12-10 ENCOUNTER — Telehealth: Payer: Self-pay | Admitting: Neurology

## 2012-12-10 ENCOUNTER — Ambulatory Visit: Payer: Medicaid Other

## 2012-12-11 NOTE — Telephone Encounter (Signed)
Duplicate message.   Patient meant Lyrica, not Lipitor

## 2012-12-11 NOTE — Telephone Encounter (Signed)
I have not had access to Epic for over 2 weeks.  We have never prescribed Lipitor for this patient, that is typically prescribed by a PCP.  I called the patient.  She said she did ask for Lipitor, but meant Lyrica.  Says the pharmacy does have Rx, however, now it needs a prior auth.  Explained the prior auth process to the patient.  She verbalized understanding.

## 2012-12-12 ENCOUNTER — Ambulatory Visit
Admission: RE | Admit: 2012-12-12 | Discharge: 2012-12-12 | Disposition: A | Discharge status: Home / Self Care | Payer: Medicaid Other | Source: Ambulatory Visit | Attending: Neurology | Admitting: Neurology

## 2012-12-12 DIAGNOSIS — R52 Pain, unspecified: Secondary | ICD-10-CM

## 2012-12-12 DIAGNOSIS — F411 Generalized anxiety disorder: Secondary | ICD-10-CM

## 2012-12-12 DIAGNOSIS — F329 Major depressive disorder, single episode, unspecified: Secondary | ICD-10-CM

## 2012-12-12 DIAGNOSIS — R55 Syncope and collapse: Secondary | ICD-10-CM

## 2012-12-25 ENCOUNTER — Telehealth: Payer: Self-pay | Admitting: Neurology

## 2012-12-25 ENCOUNTER — Telehealth: Payer: Self-pay | Admitting: Internal Medicine

## 2012-12-25 NOTE — Telephone Encounter (Signed)
Pt called in today to see if she can come in for a nurse visit for refill  tiZANidine (ZANAFLEX) 4 MG capsule;

## 2012-12-25 NOTE — Telephone Encounter (Signed)
Patient left message that she wants doctor to call Medicaid to get Lyrica approved.  I left message for patient that [facility] will contact us for the PA (which I believe was already done) and the insurance makes the decision if it will cover the medication.  Told her I would forward to our pharmacy tech.

## 2012-12-26 NOTE — Telephone Encounter (Signed)
Medicaid has denied the prior auth request.  Lyrica is non-formulary.  They prefer a trial and failure of at least two preferred drugs.  Gabapentin, Flexeril, Naproxen, Meloxicam, Amitriptyline or Venlafaxine.  Would you like to change to a formulary alternative?  Please advise.  Thank you.

## 2012-12-27 ENCOUNTER — Ambulatory Visit: Payer: Medicaid Other

## 2012-12-27 MED ORDER — GABAPENTIN 300 MG PO CAPS: 300.0000 mg | ORAL_CAPSULE | Freq: Three times a day (TID) | ORAL | Status: DC

## 2012-12-27 NOTE — Telephone Encounter (Signed)
Mariah Meyer  Chart and  note reviewed, please let patient know, her insurance denies Lyrica, I will start gabapentin 300mg  tid. Rx called in.

## 2012-12-28 NOTE — Telephone Encounter (Signed)
I spoke with the patient and explained a new med was called in for her.  She said she will try it, but plans on going to the pain clinic soon, and hopes they will prescribe something else.  Says she will pick up Gabapentin at the pharmacy and see if it works.  Will call us back if needed.

## 2013-01-01 ENCOUNTER — Ambulatory Visit: Payer: Medicaid Other | Admitting: Internal Medicine

## 2013-01-08 ENCOUNTER — Ambulatory Visit: Payer: Medicaid Other | Attending: Internal Medicine | Admitting: Internal Medicine

## 2013-01-08 ENCOUNTER — Encounter: Payer: Self-pay | Admitting: Internal Medicine

## 2013-01-08 VITALS — BP 138/83 | HR 85 | Temp 98.7°F | Resp 20 | Ht <= 58 in | Wt 173.0 lb

## 2013-01-08 DIAGNOSIS — J45901 Unspecified asthma with (acute) exacerbation: Secondary | ICD-10-CM

## 2013-01-08 DIAGNOSIS — F411 Generalized anxiety disorder: Secondary | ICD-10-CM

## 2013-01-08 DIAGNOSIS — F419 Anxiety disorder, unspecified: Secondary | ICD-10-CM

## 2013-01-08 DIAGNOSIS — R0602 Shortness of breath: Secondary | ICD-10-CM | POA: Insufficient documentation

## 2013-01-08 MED ORDER — ALBUTEROL SULFATE (2.5 MG/3ML) 0.083% IN NEBU
2.5000 mg | INHALATION_SOLUTION | Freq: Once | RESPIRATORY_TRACT | Status: AC
  Administered 2013-01-08: 2.5 mg via RESPIRATORY_TRACT

## 2013-01-08 NOTE — Progress Notes (Signed)
Pt comes in with acute asthma exacerbation that started last week with worsening unrelieved by x2 neb treatment. C/o chest tightness No wheezing or distress noted.Sats 99%r/a

## 2013-01-08 NOTE — Progress Notes (Signed)
Patient ID: Mariah Meyer, female   DOB: [birth year], 47 y.o.   MRN: [id number] Subjective: 47 year old female with history of anxiety, chronic depression, asthma, and neuropathy presented to the clinic as a walk-in with symptom of asthma exacerbation for past 2 days . She reports that she was having difficulty breathing now has gotten worse. She used her nebulizer twice yesterday without much relief and once this morning. She also reports a dry cough. She denies any fever, chills, headache, blurred vision, dizziness, nausea, vomiting, chest pain, palpitations, orthopnea, PND, leg swelling, recent travel or sick contacts.  Vitals presentation was normal. Her oxygen saturation was normal on room air. Heart rate was within normal limits. Patient given a dose of albuterol nebulizer.  Objective:  Vital signs in last 24 hours:  Filed Vitals:   01/08/13 0922  BP: 138/83  Pulse: 85  Temp: 98.7 F (37.1 C)  TempSrc: Oral  Resp: 20  Height: 4\' 9"  (1.448 m)  Weight: 173 lb (78.472 kg)  SpO2: 99%        Physical Exam:  General: Middle aged female appears in distress with rapid shallow breathing HEENT: no pallor, no icterus, moist oral mucosa, no JVD, no lymphadenopathy Heart: Normal  s1 &s2  Regular rate and rhythm, without murmurs, rubs, gallops. Lungs: Clear to auscultation bilaterally. Abdomen: Soft, nontender, nondistended, positive bowel sounds. Extremities: No clubbing cyanosis or edema with positive pedal pulses. Neuro: Alert, awake, oriented x3, nonfocal.   Lab Results:  Basic Metabolic Panel:    Component Value Date/Time   NA 140 11/27/2012 1119   NA 138 11/21/2012 0904   K 4.1 11/27/2012 1119   CL 102 11/27/2012 1119   CO2 22 11/27/2012 1119   BUN 11 11/27/2012 1119   BUN 10 11/21/2012 0904   CREATININE 0.65 11/27/2012 1119   CREATININE 0.69 11/21/2012 0904   GLUCOSE 75 11/27/2012 1119   GLUCOSE 91 11/21/2012 0904   CALCIUM 9.6 11/27/2012 1119   CBC:    Component Value  Date/Time   WBC 7.4 11/27/2012 1119   WBC 6.7 11/21/2012 0904   HGB 13.5 11/27/2012 1119   HCT 40.6 11/27/2012 1119   PLT 286 11/27/2012 1119   MCV 93 11/27/2012 1119   NEUTROABS 3.8 11/27/2012 1119   NEUTROABS 3.5 11/21/2012 0904   LYMPHSABS 2.9 11/27/2012 1119   LYMPHSABS 2.5 11/21/2012 0904   MONOABS 0.6 11/21/2012 0904   EOSABS 0.0 11/27/2012 1119   EOSABS 0.1 11/21/2012 0904   BASOSABS 0.0 11/27/2012 1119   BASOSABS 0.0 11/21/2012 0904    No results found for this or any previous visit (from the past 240 hour(s)).  Studies/Results: No results found.  Medications: Scheduled Meds: . albuterol  2.5 mg Nebulization Once   Continuous Infusions:  PRN Meds:.  Assessment/Plan:  Acute shortness of breath Symptoms are more likely related to acute anxiety day and asthma exacerbation. No wheezing or rhonchi on exam. -She denies having any acute distress. She denies use of any illicit drugs. Her recent TSH was low but with normal free T3 and T4. Blood work done recently was otherwise unremarkable. I have reassured the patient that she does not have acute asthma symptoms and let her relax in the office for a few minutes. She has prescription for Ativan which he takes 0.5 mg twice daily when necessary. With severe anxiety symptoms she can take 1 tablet twice daily as needed are now. I have instructed her to quit smoking and reduce intake of caffeine. She also reports smoking  marijuana and have instructed her to quit that as well. She does not need any further workup at this time. I have instructed her to go to the ED if she has similar acute symptoms.  Followup in the clinic as previously scheduled    Mariah Meyer 01/08/2013, 9:29 AM

## 2013-01-08 NOTE — Patient Instructions (Signed)
Pt given albuterol treatment x 1 and told to relax r/t increase anxiety

## 2013-01-10 ENCOUNTER — Ambulatory Visit: Payer: Medicaid Other | Attending: Internal Medicine | Admitting: Internal Medicine

## 2013-01-10 ENCOUNTER — Encounter: Payer: Self-pay | Admitting: Internal Medicine

## 2013-01-10 VITALS — BP 136/88 | HR 68 | Temp 98.2°F | Resp 16 | Wt 174.0 lb

## 2013-01-10 DIAGNOSIS — F411 Generalized anxiety disorder: Secondary | ICD-10-CM | POA: Insufficient documentation

## 2013-01-10 DIAGNOSIS — F329 Major depressive disorder, single episode, unspecified: Secondary | ICD-10-CM

## 2013-01-10 DIAGNOSIS — N920 Excessive and frequent menstruation with regular cycle: Secondary | ICD-10-CM | POA: Insufficient documentation

## 2013-01-10 DIAGNOSIS — J309 Allergic rhinitis, unspecified: Secondary | ICD-10-CM

## 2013-01-10 DIAGNOSIS — F3289 Other specified depressive episodes: Secondary | ICD-10-CM | POA: Insufficient documentation

## 2013-01-10 DIAGNOSIS — F17200 Nicotine dependence, unspecified, uncomplicated: Secondary | ICD-10-CM

## 2013-01-10 MED ORDER — FLUTICASONE PROPIONATE 50 MCG/ACT NA SUSP: 2.0000 | Freq: Every day | NASAL | Status: AC

## 2013-01-10 MED ORDER — NICOTINE 21 MG/24HR TD PT24: 21.0000 mg | MEDICATED_PATCH | Freq: Every day | TRANSDERMAL | Status: AC

## 2013-01-10 NOTE — Progress Notes (Signed)
MRN: [id number] Name: Stephaie Dardis  Sex: female Age: 47 y.o. DOB: 09/10/1965  Allergies: Percocet; Risperidone and related; and Sulfa antibiotics  Chief Complaint  Patient presents with  . Establish Care    HPI: Patient is 47 y.o. female who comes today for followup she was seen a couple of months ago for medication refill, she has history of asthma depression anxiety, back pain she already saw a neurologist and had a blood work done which was reviewed, she also reported to have menorrhagia ?history of fibroid used to see gynecologist in the past, also has history of anxiety depression ?bipolar disorder currently on Abilify and Ativan when necessary, has not seen a psychiatrist recently, patient is mostly concerned about her heavy periods and is taking iron supplements, she also reported to have a lot of nasal congestion postnasal drip denies any fever chills, patient does smoke cigarettes everyday advised to quit smoking she is agreeable to try nicotine patch.  Past Medical History  Diagnosis Date  . Asthma   . Endometriosis   . Depression   . Fibroid   . TMJ (temporomandibular joint disorder)   . Genital herpes 2009  . Heart murmur   . Bipolar disorder, unspecified     Past Surgical History  Procedure Laterality Date  . Ovarian cyst surgery    . Tubal ligation  07/1992      Medication List       This list is accurate as of: 01/10/13 12:25 PM.  Always use your most recent med list.               albuterol 108 (90 BASE) MCG/ACT inhaler  Commonly known as:  PROVENTIL HFA;VENTOLIN HFA  Inhale 2 puffs into the lungs every 6 (six) hours as needed for wheezing.     ARIPiprazole 5 MG tablet  Commonly known as:  ABILIFY  Take 1 tablet (5 mg total) by mouth daily.     cholecalciferol 1000 UNITS tablet  Commonly known as:  VITAMIN D  Take 1,000 Units by mouth daily.     etodolac 500 MG tablet  Commonly known as:  LODINE  Take 1 tablet (500 mg total) by mouth 2 (two) times  daily.     ferrous sulfate dried 160 (50 FE) MG Tbcr SR tablet  Commonly known as:  SLOW FE  Take 1 tablet (160 mg total) by mouth 2 (two) times daily with a meal.     fluticasone 110 MCG/ACT inhaler  Commonly known as:  FLOVENT HFA  Inhale 1 puff into the lungs 2 (two) times daily.     fluticasone 50 MCG/ACT nasal spray  Commonly known as:  FLONASE  Place 2 sprays into both nostrils daily.     gabapentin 300 MG capsule  Commonly known as:  NEURONTIN  Take 1 capsule (300 mg total) by mouth 3 (three) times daily.     LORazepam 1 MG tablet  Commonly known as:  ATIVAN  Take 1 tablet (1 mg total) by mouth 2 (two) times daily as needed for anxiety.     nicotine 21 mg/24hr patch  Commonly known as:  NICODERM CQ  Place 1 patch (21 mg total) onto the skin daily.     omeprazole 40 MG capsule  Commonly known as:  PRILOSEC  Take 1 capsule (40 mg total) by mouth daily.     oxyCODONE-acetaminophen 5-325 MG per tablet  Commonly known as:  ROXICET  Take 1 tablet by mouth every 8 (eight) hours as needed  for pain.     potassium chloride 10 MEQ tablet  Commonly known as:  K-DUR  Take 1 tablet (10 mEq total) by mouth 2 (two) times daily.     pregabalin 50 MG capsule  Commonly known as:  LYRICA  Take 1 capsule (50 mg total) by mouth 3 (three) times daily.     tiZANidine 4 MG capsule  Commonly known as:  ZANAFLEX  Take 4 mg by mouth 3 (three) times daily as needed for muscle spasms.     triamcinolone cream 0.1 %  Commonly known as:  KENALOG  Apply topically 2 (two) times daily.        Meds ordered this encounter  Medications  . nicotine (NICODERM CQ) 21 mg/24hr patch    Sig: Place 1 patch (21 mg total) onto the skin daily.    Dispense:  28 patch    Refill:  0  . fluticasone (FLONASE) 50 MCG/ACT nasal spray    Sig: Place 2 sprays into both nostrils daily.    Dispense:  16 g    Refill:  6     There is no immunization history on file for this patient.  Family History   Problem Relation Age of Onset  . Alcoholism Mother   . Arthritis    . Asthma Mother   . Cancer    . Chronic bronchitis    . Diabetes    . Emphysema    . HIV Mother   . Depression Mother   . Migraines    . Breast cancer Maternal Aunt     History  Substance Use Topics  . Smoking status: Current Every Day Smoker -- 0.50 packs/day    Types: Cigarettes  . Smokeless tobacco: Never Used  . Alcohol Use: 0.5 oz/week    1 drink(s) per week     Comment: daily    Review of Systems  As noted in HPI  Filed Vitals:   01/10/13 1101  BP: 136/88  Pulse: 68  Temp: 98.2 F (36.8 C)  Resp: 16    Physical Exam  Physical Exam  HENT:  Nasal congestion no sinus tenderness  Eyes: EOM are normal. Pupils are equal, round, and reactive to light.  Cardiovascular: Normal rate and regular rhythm.   Pulmonary/Chest: Breath sounds normal. No respiratory distress. She has no wheezes. She has no rales.  Abdominal: Soft. There is no tenderness.    CBC    Component Value Date/Time   WBC 7.4 11/27/2012 1119   WBC 6.7 11/21/2012 0904   RBC 4.37 11/27/2012 1119   RBC 4.02 11/21/2012 0904   HGB 13.5 11/27/2012 1119   HCT 40.6 11/27/2012 1119   PLT 286 11/27/2012 1119   MCV 93 11/27/2012 1119   LYMPHSABS 2.9 11/27/2012 1119   LYMPHSABS 2.5 11/21/2012 0904   MONOABS 0.6 11/21/2012 0904   EOSABS 0.0 11/27/2012 1119   EOSABS 0.1 11/21/2012 0904   BASOSABS 0.0 11/27/2012 1119   BASOSABS 0.0 11/21/2012 0904    CMP     Component Value Date/Time   NA 140 11/27/2012 1119   NA 138 11/21/2012 0904   K 4.1 11/27/2012 1119   CL 102 11/27/2012 1119   CO2 22 11/27/2012 1119   GLUCOSE 75 11/27/2012 1119   GLUCOSE 91 11/21/2012 0904   BUN 11 11/27/2012 1119   BUN 10 11/21/2012 0904   CREATININE 0.65 11/27/2012 1119   CREATININE 0.69 11/21/2012 0904   CALCIUM 9.6 11/27/2012 1119   PROT 7.0 11/27/2012 1119  PROT 6.1 11/21/2012 0904   ALBUMIN 3.8 11/21/2012 0904   AST 21 11/27/2012 1119   ALT 13 11/27/2012  1119   ALKPHOS 62 11/27/2012 1119   BILITOT 0.3 11/27/2012 1119   GFRNONAA 108 11/27/2012 1119   GFRAA 124 11/27/2012 1119    No results found for this basename: chol,  tri,  ldl    No components found with this basename: hga1c    Lab Results  Component Value Date/Time   AST 21 11/27/2012 11:19 AM    Assessment and Plan  Anxiety state, unspecified - Plan: Ambulatory referral to Psychiatry for further management  Depression - Plan: Ambulatory referral to Psychiatry  Menorrhagia - Plan: Ambulatory referral to Gynecology for further management  Allergic rhinitis - Plan:will try fluticasone (FLONASE) 50 MCG/ACT nasal spray  Smoking - Plan:will try nicotine (NICODERM CQ) 21 mg/24hr patch   Return in about 2 months (around 03/13/2013).  Doris Cheadle, MD

## 2013-01-10 NOTE — Progress Notes (Signed)
Patient here to establish care Would like to come off some of her depression medications Needs referral to ob/gyn

## 2013-01-21 ENCOUNTER — Emergency Department (HOSPITAL_COMMUNITY)
Admission: EM | Admit: 2013-01-21 | Discharge: 2013-01-21 | Disposition: A | Discharge status: Home / Self Care | Payer: Medicaid Other | Source: Home / Self Care | Attending: Family Medicine | Admitting: Family Medicine

## 2013-01-21 ENCOUNTER — Ambulatory Visit (HOSPITAL_COMMUNITY): Payer: Medicaid Other | Attending: Family Medicine

## 2013-01-21 ENCOUNTER — Encounter (HOSPITAL_COMMUNITY): Payer: Self-pay | Admitting: Emergency Medicine

## 2013-01-21 DIAGNOSIS — R05 Cough: Secondary | ICD-10-CM

## 2013-01-21 DIAGNOSIS — R071 Chest pain on breathing: Secondary | ICD-10-CM

## 2013-01-21 DIAGNOSIS — M4180 Other forms of scoliosis, site unspecified: Secondary | ICD-10-CM | POA: Insufficient documentation

## 2013-01-21 DIAGNOSIS — R0781 Pleurodynia: Secondary | ICD-10-CM

## 2013-01-21 DIAGNOSIS — R0789 Other chest pain: Secondary | ICD-10-CM | POA: Insufficient documentation

## 2013-01-21 MED ORDER — IPRATROPIUM BROMIDE 0.06 % NA SOLN: 2.0000 | Freq: Four times a day (QID) | NASAL | Status: AC

## 2013-01-21 MED ORDER — NAPROXEN 500 MG PO TABS: 500.0000 mg | ORAL_TABLET | Freq: Two times a day (BID) | ORAL | Status: DC | PRN

## 2013-01-21 NOTE — ED Provider Notes (Signed)
Mariah Meyer is a 47 y.o. female who presents to Urgent Care today for Right-sided chest pain. Patient has had 5 days of bothersome cough.  She was seen at an urgent care in [facility] where she was diagnosed with a URI and given Cheratussin cough suppressant and amoxicillin. This has helped her cough however she notes right-sided chest wall pain. This is worse with deep inspiration and cough. It is moderate. The Cheratussin does not help much for the pain. She has not tried any other medications. She denies any central crushing, exertional or radiating chest pain.   Past Medical History  Diagnosis Date  . Asthma   . Endometriosis   . Depression   . Fibroid   . TMJ (temporomandibular joint disorder)   . Genital herpes 2009  . Heart murmur   . Bipolar disorder, unspecified    History  Substance Use Topics  . Smoking status: Current Every Day Smoker -- 0.50 packs/day    Types: Cigarettes  . Smokeless tobacco: Never Used  . Alcohol Use: 0.5 oz/week    1 drink(s) per week     Comment: daily   ROS as above Medications reviewed. No current facility-administered medications for this encounter.   Current Outpatient Prescriptions  Medication Sig Dispense Refill  . albuterol (PROVENTIL HFA;VENTOLIN HFA) 108 (90 BASE) MCG/ACT inhaler Inhale 2 puffs into the lungs every 6 (six) hours as needed for wheezing.  1 Inhaler  11  . ARIPiprazole (ABILIFY) 5 MG tablet Take 1 tablet (5 mg total) by mouth daily.  30 tablet  1  . cholecalciferol (VITAMIN D) 1000 UNITS tablet Take 1,000 Units by mouth daily.      Marland Kitchen etodolac (LODINE) 500 MG tablet Take 1 tablet (500 mg total) by mouth 2 (two) times daily.  60 tablet  3  . ferrous sulfate dried (SLOW FE) 160 (50 FE) MG TBCR SR tablet Take 1 tablet (160 mg total) by mouth 2 (two) times daily with a meal.  60 tablet  3  . fluticasone (FLONASE) 50 MCG/ACT nasal spray Place 2 sprays into both nostrils daily.  16 g  6  . fluticasone (FLOVENT HFA)  110 MCG/ACT inhaler Inhale 1 puff into the lungs 2 (two) times daily.  1 Inhaler  5  . gabapentin (NEURONTIN) 300 MG capsule Take 1 capsule (300 mg total) by mouth 3 (three) times daily.  90 capsule  11  . ipratropium (ATROVENT) 0.06 % nasal spray Place 2 sprays into both nostrils 4 (four) times daily.  15 mL  1  . LORazepam (ATIVAN) 1 MG tablet Take 1 tablet (1 mg total) by mouth 2 (two) times daily as needed for anxiety.  60 tablet  1  . naproxen (NAPROSYN) 500 MG tablet Take 1 tablet (500 mg total) by mouth 2 (two) times daily as needed (pain).  60 tablet  0  . nicotine (NICODERM CQ) 21 mg/24hr patch Place 1 patch (21 mg total) onto the skin daily.  28 patch  0  . omeprazole (PRILOSEC) 40 MG capsule Take 1 capsule (40 mg total) by mouth daily.  30 capsule  3  . oxyCODONE-acetaminophen (ROXICET) 5-325 MG per tablet Take 1 tablet by mouth every 8 (eight) hours as needed for pain.  65 tablet  0  . potassium chloride (K-DUR) 10 MEQ tablet Take 1 tablet (10 mEq total) by mouth 2 (two) times daily.  60 tablet  3  . pregabalin (LYRICA) 50 MG capsule Take 1 capsule (50 mg total)  by mouth 3 (three) times daily.  90 capsule  5  . tiZANidine (ZANAFLEX) 4 MG capsule Take 4 mg by mouth 3 (three) times daily as needed for muscle spasms.      Marland Kitchen triamcinolone cream (KENALOG) 0.1 % Apply topically 2 (two) times daily.  30 g  3    Exam:  BP 121/88  Pulse 72  Temp(Src) 98.3 F (36.8 C) (Oral)  Resp 16  SpO2 99%  LMP 01/10/2013 Gen: Well NAD HEENT: EOMI,  MMMposterior pharynx with cobblestoning. Tympanic membranes are normal appearing bilaterally Lungs: Normal work of breathing. CTABL  Right chest wall. Tender palpation. No crepitations palpated. Heart: RRR no MRG Abd: NABS, Soft. NT, ND Exts:  warm and well perfused.   No results found for this or any previous visit (from the past 24 hour(s)). Dg Ribs Unilateral W/chest Right  01/21/2013   CLINICAL DATA:  Right lateral rib pain  EXAM: RIGHT RIBS AND  CHEST - 3+ VIEW  COMPARISON:  None.  FINDINGS: Three views right ribs submitted. No right rib fracture is identified. No acute infiltrate or pulmonary edema. Mild thoracic dextroscoliosis. No diagnostic pneumothorax.  IMPRESSION: Negative.  Mild thoracic dextroscoliosis.   Electronically Signed   By: Natasha Mead M.D.   On: 01/21/2013 10:48    Assessment and Plan: 47 y.o. female with pleuritic-type chest pain in the setting of a cough. X-rays are normal. The patient's vital signs are also normal. Plan to treat with Atrovent nasal spray. Additionally we'll use NSAIDs for pain control. Patient will continue hydrocodone-based cough medication as needed. Discussed warning signs or symptoms. Please see discharge instructions. Patient expresses understanding.      Rodolph Bong, MD 01/21/13 1115

## 2013-01-21 NOTE — ED Notes (Signed)
Pt c/o pain on right side rib cage onset 3 weeks... Has been to multiple health facilities and has been dx w/URI and anxiety Rx Amox, tizanidine, Cheratussin w/no relief... Pain increases w/activity, breathing, coughing Denies: f/v/n/d She is alert w/no signs of acute distress.

## 2013-01-21 NOTE — ED Notes (Signed)
Patient transported to X-ray 

## 2013-01-31 ENCOUNTER — Ambulatory Visit: Payer: Medicaid Other | Attending: Internal Medicine | Admitting: Family Medicine

## 2013-01-31 VITALS — BP 129/86 | HR 64 | Temp 97.5°F | Resp 14 | Ht <= 58 in | Wt 175.4 lb

## 2013-01-31 DIAGNOSIS — R091 Pleurisy: Secondary | ICD-10-CM | POA: Insufficient documentation

## 2013-01-31 NOTE — Progress Notes (Signed)
Pt is here for a check up. Recently at [facility]. Complains of loss of voice. Requests to see a nurse for diagnosis. Pt just wants to know is she okay. Symptoms are x 3 weeks.

## 2013-01-31 NOTE — Progress Notes (Signed)
   Subjective:    Patient ID: Mariah Meyer, female    DOB: [birth year], 48 y.o.   MRN: [id number]  HPI Patient is here today in followup from an urgent care visit for chest pain. She has a history of asthma and had a viral infection. Plan pain was deemed to be pleuritic at that time and she was prescribed nonsteroidals. She had previously been prescribed Cheratussin and that was continued. Today the patient says that her breathing is much better and the cough is pretty much resolved but she still has discomfort. She is taking naproxen 500 mg 2-3 times a day.   Review of Systems A 12 point review of systems is negative except as per hpi.      Objective:   Physical Exam  Nursing note and vitals reviewed. Constitutional: She is oriented to person, place, and time. She appears well-developed and well-nourished.  HENT:   Mouth/Throat: Oropharynx is clear and moist. No oropharyngeal exudate.  Eyes: Conjunctivae are normal. Pupils are equal, round, and reactive to light.  Neck: Normal range of motion. Neck supple. No thyromegaly present.  Cardiovascular: Normal rate, regular rhythm and normal heart sounds.   Pulmonary/Chest: Effort normal and breath sounds normal.  Abdominal: Soft. Bowel sounds are normal. She exhibits no distension. There is no tenderness. There is no rebound.  Lymphadenopathy:    She has no cervical adenopathy.  Neurological: She is alert and oriented to person, place, and time. She has normal reflexes.  Skin: Skin is warm and dry.  Psychiatric: She has a normal mood and affect. Her behavior is normal.        Assessment & Plan:  Mariah Meyer was seen today for office visit.  Diagnoses and associated orders for this visit:  Pleuritis She looks great today and I see no results she cannot continue back with work and school. I've advised her to continue the nonsteroidals as needed for pain but to watch out for stomach discomfort and discontinued them if she does have GI symptoms.  Have asked her only to take the 500 mg of naproxen twice a day and to take Tylenol as needed for breakthrough pain. If she has any questions she'll give [place] a call followup as needed or for her annual exam.

## 2013-03-15 ENCOUNTER — Encounter: Payer: Medicaid Other | Admitting: Family Medicine

## 2013-04-15 ENCOUNTER — Ambulatory Visit (INDEPENDENT_AMBULATORY_CARE_PROVIDER_SITE_OTHER): Payer: Medicaid Other | Admitting: Family Medicine

## 2013-04-15 ENCOUNTER — Encounter: Payer: Self-pay | Admitting: Family Medicine

## 2013-04-15 VITALS — BP 128/90 | HR 71 | Ht <= 58 in | Wt 168.4 lb

## 2013-04-15 DIAGNOSIS — N920 Excessive and frequent menstruation with regular cycle: Secondary | ICD-10-CM

## 2013-04-15 DIAGNOSIS — N898 Other specified noninflammatory disorders of vagina: Secondary | ICD-10-CM

## 2013-04-15 LAB — POCT PREGNANCY, URINE: Preg Test, Ur: NEGATIVE

## 2013-04-15 NOTE — Progress Notes (Signed)
Pt reports that she bleeds once every month 5-7 days.  Second and third days of cycle are very heavy with clots. She states that she has fibroids. She has seen a lot of gynecologists about this problem since 2009.

## 2013-04-15 NOTE — Progress Notes (Signed)
Subjective:     Patient ID: Mariah Meyer, female   DOB: 1965/09/03, 48 y.o.   MRN: [id number]  HPI Mariah Meyer is a 48 y.o. [id number] here for evaluation of heavy periods.s/p BTL. In a same sex relationship. Pt reports period every 28 days. Heavy day 2-3 then tapers and resolves. Pt reports that she is having cramping, headaches, see spots 1 week before period and throughout period. Pt takes naprosyn and ativan for the pain.   Review of Systems No fevers/chills, no cp, sob, occ nausea/ no vomiting. Chronic cold in feet.    Objective:   Physical Exam Filed Vitals:   04/15/13 1315  BP: 128/90  Pulse: 71  VSS mildly elevated diastolic, NAD Non gravid, NTTP, no CMT, appropriate size uterus SSE: mild white discharge No c/c/e  homgenous uterus on [place]. Stripe 80mm 2 fibroids    Assessment:     Mariah Meyer is a 48 y.o. [phone] here with menorrhagia - Wet mount today hx of trich and BV. - f/u April 15 for IUD placement to attempt to control symptoms

## 2013-04-15 NOTE — Patient Instructions (Signed)
Menorrhagia  Menorrhagia is the medical term for when your menstrual periods are heavy or last longer than usual. With menorrhagia, every period you have may cause enough blood loss and cramping that you are unable to maintain your usual activities.  CAUSES   In some cases, the cause of heavy periods is unknown, but a number of conditions may cause menorrhagia. Common causes include:  · A problem with the hormone-producing thyroid gland (hypothyroid).  · Noncancerous growths in the uterus (polyps or fibroids).  · An imbalance of the estrogen and progesterone hormones.  · One of your ovaries not releasing an egg during one or more months.  · Side effects of having an intrauterine device (IUD).  · Side effects of some medicines, such as anti-inflammatory medicines or blood thinners.  · A bleeding disorder that stops your blood from clotting normally.  SIGNS AND SYMPTOMS   During a normal period, bleeding lasts between 4 and 8 days. Signs that your periods are too heavy include:  · You routinely have to change your pad or tampon every 1 or 2 hours because it is completely soaked.  · You pass blood clots larger than 1 inch (2.5 cm) in size.  · You have bleeding for more than 7 days.  · You need to use pads and tampons at the same time because of heavy bleeding.  · You need to wake up to change your pads or tampons during the night.  · You have symptoms of anemia, such as tiredness, fatigue, or shortness of breath.   DIAGNOSIS   Your health care provider will perform a physical exam and ask you questions about your symptoms and menstrual history. Other tests may be ordered based on what the health care provider finds during the exam. These tests can include:  · Blood tests To check if you are pregnant or have hormonal changes, a bleeding or thyroid disorder, low iron levels (anemia), or other problems.  · Endometrial biopsy Your health care provider takes a sample of tissue from the inside of your uterus to be examined  under a microscope.  · Pelvic ultrasound This test uses sound waves to make a picture of your uterus, ovaries, and vagina. The pictures can show if you have fibroids or other growths.  · Hysteroscopy For this test, your health care provider will use a small telescope to look inside your uterus.  Based on the results of your initial tests, your health care provider may recommend further testing.  TREATMENT   Treatment may not be needed. If it is needed, your health care provider may recommend treatment with one or more medicines first. If these do not reduce bleeding enough, a surgical treatment might be an option. The best treatment for you will depend on:   · Whether you need to prevent pregnancy.    · Your desire to have children in the future.  · The cause and severity of your bleeding.  · Your opinion and personal preference.    Medicines for menorrhagia may include:  · Birth control methods that use hormones These include the pill, skin patch, vaginal ring, shots that you get every 3 months, hormonal IUD, and implant. These treatments reduce bleeding during your menstrual period.  · Medicines that thicken blood and slow bleeding.  · Medicines that reduce swelling, such as ibuprofen.   · Medicines that contain a synthetic hormone called progestin.    · Medicines that make the ovaries stop working for a short time.    You may need surgical   treatment for menorrhagia if the medicines are unsuccessful. Treatment options include:  · Dilation and curettage (D&C) In this procedure, your health care provider opens (dilates) your cervix and then scrapes or suctions tissue from the lining of your uterus to reduce menstrual bleeding.  · Operative hysteroscopy This procedure uses a tiny tube with a light (hysteroscope) to view your uterine cavity and can help in the surgical removal of a polyp that may be causing heavy periods.  · Endometrial ablation Through various techniques, your health care provider permanently  destroys the entire lining of your uterus (endometrium). After endometrial ablation, most women have little or no menstrual flow. Endometrial ablation reduces your ability to become pregnant.  · Endometrial resection This surgical procedure uses an electrosurgical wire loop to remove the lining of the uterus. This procedure also reduces your ability to become pregnant.  · Hysterectomy Surgical removal of the uterus and cervix is a permanent procedure that stops menstrual periods. Pregnancy is not possible after a hysterectomy. This procedure requires anesthesia and hospitalization.  HOME CARE INSTRUCTIONS   · Only take over-the-counter or prescription medicines as directed by your health care provider. Take prescribed medicines exactly as directed. Do not change or switch medicines without consulting your health care provider.  · Take any prescribed iron pills exactly as directed by your health care provider. Long-term heavy bleeding may result in low iron levels. Iron pills help replace the iron your body lost from heavy bleeding. Iron may cause constipation. If this becomes a problem, increase the bran, fruits, and roughage in your diet.  · Do not take aspirin or medicines that contain aspirin 1 week before or during your menstrual period. Aspirin may make the bleeding worse.  · If you need to change your sanitary pad or tampon more than once every 2 hours, stay in bed and rest as much as possible until the bleeding stops.  · Eat well-balanced meals. Eat foods high in iron. Examples are leafy green vegetables, meat, liver, eggs, and whole grain breads and cereals. Do not try to lose weight until the abnormal bleeding has stopped and your blood iron level is back to normal.  SEEK MEDICAL CARE IF:   · You soak through a pad or tampon every 1 or 2 hours, and this happens every time you have a period.  · You need to use pads and tampons at the same time because you are bleeding so much.  · You need to change your pad  or tampon during the night.  · You have a period that lasts for more than 8 days.  · You pass clots bigger than 1 inch wide.  · You have irregular periods that happen more or less often than once a month.  · You feel dizzy or faint.  · You feel very weak or tired.  · You feel short of breath or feel your heart is beating too fast when you exercise.  · You have nausea and vomiting or diarrhea while you are taking your medicine.  · You have any problems that may be related to the medicine you are taking.  SEEK IMMEDIATE MEDICAL CARE IF:   · You soak through 4 or more pads or tampons in 2 hours.  · You have any bleeding while you are pregnant.  MAKE SURE YOU:   · Understand these instructions.  · Will watch your condition.  · Will get help right away if you are not doing well or get worse.    Document Released: 01/10/2005 Document Revised: 10/31/2012 Document Reviewed: 07/01/2012  [employer]® Patient Information ©2014 [employer].

## 2013-04-16 LAB — WET PREP, GENITAL
Trich, Wet Prep: NONE SEEN
WBC, Wet Prep HPF POC: NONE SEEN
Yeast Wet Prep HPF POC: NONE SEEN

## 2013-04-16 NOTE — Progress Notes (Signed)
+   clue cells, and discharge but no malodor or patient complaint. Address at follow up and can treat if patietn desires.

## 2013-05-14 ENCOUNTER — Ambulatory Visit: Payer: Medicaid Other | Admitting: Family Medicine

## 2013-05-28 ENCOUNTER — Ambulatory Visit (INDEPENDENT_AMBULATORY_CARE_PROVIDER_SITE_OTHER): Payer: Medicaid Other | Admitting: Obstetrics & Gynecology

## 2013-05-28 ENCOUNTER — Encounter: Payer: Self-pay | Admitting: Family Medicine

## 2013-05-28 VITALS — BP 117/80 | HR 75 | Temp 98.0°F | Ht <= 58 in | Wt 167.7 lb

## 2013-05-28 DIAGNOSIS — N920 Excessive and frequent menstruation with regular cycle: Secondary | ICD-10-CM

## 2013-05-28 DIAGNOSIS — Z01812 Encounter for preprocedural laboratory examination: Secondary | ICD-10-CM

## 2013-05-28 LAB — POCT PREGNANCY, URINE: PREG TEST UR: NEGATIVE

## 2013-05-28 MED ORDER — LEVONORGESTREL 20 MCG/24HR IU IUD
INTRAUTERINE_SYSTEM | Freq: Once | INTRAUTERINE | Status: AC
Start: 2013-05-28 — End: 2013-05-28
  Administered 2013-05-28: 16:00:00 1 via INTRAUTERINE

## 2013-05-28 NOTE — Progress Notes (Signed)
Patient ID: Mariah Meyer, female   DOB: 1965-10-18, 48 y.o.   MRN: [id number] [place] PROCEDURE NOTE  Mariah Meyer is a 48 y.o. [phone] here for Mirena IUD insertion. No GYN concerns.  Last pap smear was on 06/2012 and was normal.  IUD Insertion Procedure Note Patient identified, informed consent performed.  Discussed risks of irregular bleeding, cramping, infection, malpositioning or misplacement of the IUD outside the uterus which may require further procedures. Time out was performed.  Urine pregnancy test negative.  Speculum placed in the vagina.  Cervix visualized.  Cleaned with Betadine x 2.  Grasped anteriorly with a single tooth tenaculum.  Uterus sounded to 8 cm.  Mirena IUD placed per manufacturer's recommendations.  Strings trimmed to 3 cm. Tenaculum was removed, good hemostasis noted.  Patient tolerated procedure well.   Patient was given post-procedure instructions.  Patient was also asked to check IUD strings periodically and follow up in 4 weeks for IUD check.

## 2013-05-28 NOTE — Patient Instructions (Signed)
Intrauterine Device Insertion, Care After Refer to this sheet in the next few weeks. These instructions provide you with information on caring for yourself after your procedure. Your health care provider may also give you more specific instructions. Your treatment has been planned according to current medical practices, but problems sometimes occur. Call your health care provider if you have any problems or questions after your procedure. WHAT TO EXPECT AFTER THE PROCEDURE Insertion of the IUD may cause some discomfort, such as cramping. The cramping should improve after the IUD is in place. You may have bleeding after the procedure. This is normal. It varies from light spotting for a few days to menstrual-like bleeding. When the IUD is in place, a string will extend past the cervix into the vagina for 1 2 inches. The strings should not bother you or your partner. If they do, talk to your health care provider.  HOME CARE INSTRUCTIONS   Check your intrauterine device (IUD) to make sure it is in place before you resume sexual activity. You should be able to feel the strings. If you cannot feel the strings, something may be wrong. The IUD may have fallen out of the uterus, or the uterus may have been punctured (perforated) during placement. Also, if the strings are getting longer, it may mean that the IUD is being forced out of the uterus. You no longer have full protection from pregnancy if any of these problems occur.  You may resume sexual intercourse if you are not having problems with the IUD. The copper IUD is considered immediately effective, and the hormone IUD works right away if inserted within 7 days of your period starting. You will need to use a backup method of birth control for 7 days if the IUD in inserted at any other time in your cycle.  Continue to check that the IUD is still in place by feeling for the strings after every menstrual period.  You may need to take pain medicine such as  acetaminophen or ibuprofen. Only take medicines as directed by your health care provider. SEEK MEDICAL CARE IF:   You have bleeding that is heavier or lasts longer than a normal menstrual cycle.  You have a fever.  You have increasing cramps or abdominal pain not relieved with medicine.  You have abdominal pain that does not seem to be related to the same area of earlier cramping and pain.  You are lightheaded, unusually weak, or faint.  You have abnormal vaginal discharge or smells.  You have pain during sexual intercourse.  You cannot feel the IUD strings, or the IUD string has gotten longer.  You feel the IUD at the opening of the cervix in the vagina.  You think you are pregnant, or you miss your menstrual period.  The IUD string is hurting your sex partner. MAKE SURE YOU:  Understand these instructions.  Will watch your condition.  Will get help right away if you are not doing well or get worse. Document Released: 09/08/2010 Document Revised: 10/31/2012 Document Reviewed: 07/01/2012 [facility] Patient Information 2014 [place]. Levonorgestrel intrauterine device (IUD) What is this medicine? LEVONORGESTREL IUD (LEE voe nor jes trel) is a contraceptive (birth control) device. The device is placed inside the uterus by a healthcare professional. It is used to prevent pregnancy and can also be used to treat heavy bleeding that occurs during your period. Depending on the device, it can be used for 3 to 5 years. This medicine may be used for other  purposes; ask your health care provider or pharmacist if you have questions. COMMON BRAND NAME(S): Jerral Bonito What should I tell my health care provider before I take this medicine? They need to know if you have any of these conditions: -abnormal Pap smear -cancer of the breast, uterus, or cervix -diabetes -endometritis -genital or pelvic infection now or in the past -have more than one sexual partner or your partner has  more than one partner -heart disease -history of an ectopic or tubal pregnancy -immune system problems -IUD in place -liver disease or tumor -problems with blood clots or take blood-thinners -use intravenous drugs -uterus of unusual shape -vaginal bleeding that has not been explained -an unusual or allergic reaction to levonorgestrel, other hormones, silicone, or polyethylene, medicines, foods, dyes, or preservatives -pregnant or trying to get pregnant -breast-feeding How should I use this medicine? This device is placed inside the uterus by a health care professional. Talk to your pediatrician regarding the use of this medicine in children. Special care may be needed. Overdosage: If you think you have taken too much of this medicine contact a poison control center or emergency room at once. NOTE: This medicine is only for you. Do not share this medicine with others. What if I miss a dose? This does not apply. What may interact with this medicine? Do not take this medicine with any of the following medications: -amprenavir -bosentan -fosamprenavir This medicine may also interact with the following medications: -aprepitant -barbiturate medicines for inducing sleep or treating seizures -bexarotene -griseofulvin -medicines to treat seizures like carbamazepine, ethotoin, felbamate, oxcarbazepine, phenytoin, topiramate -modafinil -pioglitazone -rifabutin -rifampin -rifapentine -some medicines to treat HIV infection like atazanavir, indinavir, lopinavir, nelfinavir, tipranavir, ritonavir -St. John's wort -warfarin This list may not describe all possible interactions. Give your health care provider a list of all the medicines, herbs, non-prescription drugs, or dietary supplements you use. Also tell them if you smoke, drink alcohol, or use illegal drugs. Some items may interact with your medicine. What should I watch for while using this medicine? Visit your doctor or health care  professional for regular check ups. See your doctor if you or your partner has sexual contact with others, becomes HIV positive, or gets a sexual transmitted disease. This product does not protect you against HIV infection (AIDS) or other sexually transmitted diseases. You can check the placement of the IUD yourself by reaching up to the top of your vagina with clean fingers to feel the threads. Do not pull on the threads. It is a good habit to check placement after each menstrual period. Call your doctor right away if you feel more of the IUD than just the threads or if you cannot feel the threads at all. The IUD may come out by itself. You may become pregnant if the device comes out. If you notice that the IUD has come out use a backup birth control method like condoms and call your health care provider. Using tampons will not change the position of the IUD and are okay to use during your period. What side effects may I notice from receiving this medicine? Side effects that you should report to your doctor or health care professional as soon as possible: -allergic reactions like skin rash, itching or hives, swelling of the face, lips, or tongue -fever, flu-like symptoms -genital sores -high blood pressure -no menstrual period for 6 weeks during use -pain, swelling, warmth in the leg -pelvic pain or tenderness -severe or sudden headache -signs of pregnancy -stomach  cramping -sudden shortness of breath -trouble with balance, talking, or walking -unusual vaginal bleeding, discharge -yellowing of the eyes or skin Side effects that usually do not require medical attention (report to your doctor or health care professional if they continue or are bothersome): -acne -breast pain -change in sex drive or performance -changes in weight -cramping, dizziness, or faintness while the device is being inserted -headache -irregular menstrual bleeding within first 3 to 6 months of use -nausea This list  may not describe all possible side effects. Call your doctor for medical advice about side effects. You may report side effects to FDA at 1-800-FDA-1088. Where should I keep my medicine? This does not apply. NOTE: This sheet is a summary. It may not cover all possible information. If you have questions about this medicine, talk to your doctor, pharmacist, or health care provider.  2014, [employer]/Gold Standard. (2011-02-10 13:54:04)

## 2013-06-24 ENCOUNTER — Ambulatory Visit: Payer: Medicaid Other | Admitting: Family Medicine

## 2013-07-18 ENCOUNTER — Telehealth: Payer: Self-pay | Admitting: *Deleted

## 2013-07-18 MED ORDER — NAPROXEN 500 MG PO TABS: 500.0000 mg | ORAL_TABLET | Freq: Two times a day (BID) | ORAL | Status: DC | PRN

## 2013-07-18 NOTE — Telephone Encounter (Addendum)
Pt left message stating that she is having discomfort and request Rx of Naproxen to be sent to her [place] [facility]. After consult with Dr. Ihor Dow, I called pt and informed her that a refill of Naproxen has been approved and sent to her pharmacy. Pt voiced understanding and was very appreciative.

## 2013-08-12 ENCOUNTER — Ambulatory Visit: Payer: Medicaid Other | Admitting: Internal Medicine

## 2013-09-17 ENCOUNTER — Encounter: Payer: Self-pay | Admitting: General Practice

## 2013-09-20 ENCOUNTER — Ambulatory Visit: Payer: Medicaid Other | Attending: Internal Medicine | Admitting: Internal Medicine

## 2013-09-20 ENCOUNTER — Encounter: Payer: Self-pay | Admitting: Internal Medicine

## 2013-09-20 VITALS — BP 121/80 | HR 73 | Temp 99.1°F | Resp 16 | Wt 165.0 lb

## 2013-09-20 DIAGNOSIS — F3289 Other specified depressive episodes: Secondary | ICD-10-CM | POA: Insufficient documentation

## 2013-09-20 DIAGNOSIS — F329 Major depressive disorder, single episode, unspecified: Secondary | ICD-10-CM | POA: Insufficient documentation

## 2013-09-20 DIAGNOSIS — F411 Generalized anxiety disorder: Secondary | ICD-10-CM | POA: Insufficient documentation

## 2013-09-20 DIAGNOSIS — F17200 Nicotine dependence, unspecified, uncomplicated: Secondary | ICD-10-CM | POA: Diagnosis not present

## 2013-09-20 DIAGNOSIS — K219 Gastro-esophageal reflux disease without esophagitis: Secondary | ICD-10-CM | POA: Insufficient documentation

## 2013-09-20 DIAGNOSIS — J45909 Unspecified asthma, uncomplicated: Secondary | ICD-10-CM | POA: Diagnosis present

## 2013-09-20 MED ORDER — OMEPRAZOLE 40 MG PO CPDR: 40.0000 mg | DELAYED_RELEASE_CAPSULE | Freq: Every day | ORAL | Status: AC

## 2013-09-20 MED ORDER — NAPROXEN 500 MG PO TABS: 500.0000 mg | ORAL_TABLET | Freq: Two times a day (BID) | ORAL | Status: DC | PRN

## 2013-09-20 MED ORDER — FLUTICASONE PROPIONATE HFA 110 MCG/ACT IN AERO: 1.0000 | INHALATION_SPRAY | Freq: Two times a day (BID) | RESPIRATORY_TRACT | Status: AC

## 2013-09-20 MED ORDER — ALBUTEROL SULFATE HFA 108 (90 BASE) MCG/ACT IN AERS: 2.0000 | INHALATION_SPRAY | Freq: Four times a day (QID) | RESPIRATORY_TRACT | Status: AC | PRN

## 2013-09-20 NOTE — Progress Notes (Signed)
Patient here for medication refill.

## 2013-09-20 NOTE — Progress Notes (Signed)
MRN: [id number] Name: Mariah Meyer  Sex: female Age: 48 y.o. DOB: January 12, 1966  Allergies: Percocet; Risperidone and related; and Sulfa antibiotics  Chief Complaint  Patient presents with  . Medication Refill    HPI: Patient is 48 y.o. female who has history of asthma, GERD ,  comes today for followup, patient requesting refill on her medications denies any acute symptoms, patient still smoke cigarettes, patient is trying to quit smoking, patient has history ofanxiety/depression (following up with the psychiatrist) patient denies any acute symptoms.  Past Medical History  Diagnosis Date  . Asthma   . Endometriosis   . Depression   . Fibroid   . TMJ (temporomandibular joint disorder)   . Genital herpes 2009  . Heart murmur   . Bipolar disorder, unspecified     Past Surgical History  Procedure Laterality Date  . Ovarian cyst surgery    . Tubal ligation  07/1992      Medication List       This list is accurate as of: 09/20/13  4:27 PM.  Always use your most recent med list.               albuterol 108 (90 BASE) MCG/ACT inhaler  Commonly known as:  PROVENTIL HFA;VENTOLIN HFA  Inhale 2 puffs into the lungs every 6 (six) hours as needed for wheezing.     ARIPiprazole 5 MG tablet  Commonly known as:  ABILIFY  Take 1 tablet (5 mg total) by mouth daily.     cholecalciferol 1000 UNITS tablet  Commonly known as:  VITAMIN D  Take 1,000 Units by mouth daily.     etodolac 500 MG tablet  Commonly known as:  LODINE  Take 1 tablet (500 mg total) by mouth 2 (two) times daily.     ferrous sulfate dried 160 (50 FE) MG Tbcr SR tablet  Commonly known as:  SLOW FE  Take 1 tablet (160 mg total) by mouth 2 (two) times daily with a meal.     fluticasone 110 MCG/ACT inhaler  Commonly known as:  FLOVENT HFA  Inhale 1 puff into the lungs 2 (two) times daily.     fluticasone 50 MCG/ACT nasal spray  Commonly known as:  FLONASE  Place 2 sprays into both nostrils daily.     gabapentin 300 MG capsule  Commonly known as:  NEURONTIN  Take 1 capsule (300 mg total) by mouth 3 (three) times daily.     ipratropium 0.06 % nasal spray  Commonly known as:  ATROVENT  Place 2 sprays into both nostrils 4 (four) times daily.     LORazepam 1 MG tablet  Commonly known as:  ATIVAN  Take 1 tablet (1 mg total) by mouth 2 (two) times daily as needed for anxiety.     naproxen 500 MG tablet  Commonly known as:  NAPROSYN  Take 1 tablet (500 mg total) by mouth 2 (two) times daily as needed (pain).     nicotine 21 mg/24hr patch  Commonly known as:  NICODERM CQ  Place 1 patch (21 mg total) onto the skin daily.     omeprazole 40 MG capsule  Commonly known as:  PRILOSEC  Take 1 capsule (40 mg total) by mouth daily.     pregabalin 50 MG capsule  Commonly known as:  LYRICA  Take 1 capsule (50 mg total) by mouth 3 (three) times daily.     tiZANidine 4 MG capsule  Commonly known as:  ZANAFLEX  Take 4  mg by mouth 3 (three) times daily as needed for muscle spasms.     triamcinolone cream 0.1 %  Commonly known as:  KENALOG  Apply topically 2 (two) times daily.        Meds ordered this encounter  Medications  . albuterol (PROVENTIL HFA;VENTOLIN HFA) 108 (90 BASE) MCG/ACT inhaler    Sig: Inhale 2 puffs into the lungs every 6 (six) hours as needed for wheezing.    Dispense:  1 Inhaler    Refill:  4  . fluticasone (FLOVENT HFA) 110 MCG/ACT inhaler    Sig: Inhale 1 puff into the lungs 2 (two) times daily.    Dispense:  1 Inhaler    Refill:  5  . naproxen (NAPROSYN) 500 MG tablet    Sig: Take 1 tablet (500 mg total) by mouth 2 (two) times daily as needed (pain).    Dispense:  30 tablet    Refill:  1  . omeprazole (PRILOSEC) 40 MG capsule    Sig: Take 1 capsule (40 mg total) by mouth daily.    Dispense:  30 capsule    Refill:  3     There is no immunization history on file for this patient.  Family History  Problem Relation Age of Onset  . Alcoholism Mother   .  Arthritis    . Asthma Mother   . Cancer    . Chronic bronchitis    . Diabetes    . Emphysema    . HIV Mother   . Depression Mother   . Migraines    . Breast cancer Maternal Aunt     History  Substance Use Topics  . Smoking status: Current Every Day Smoker -- 0.50 packs/day    Types: Cigarettes  . Smokeless tobacco: Never Used  . Alcohol Use: 0.5 oz/week    1 drink(s) per week     Comment: occasionally     Review of Systems   As noted in HPI  Filed Vitals:   09/20/13 1536  BP: 121/80  Pulse: 73  Temp: 99.1 F (37.3 C)  Resp: 16    Physical Exam  Physical Exam  Constitutional: No distress.  Eyes: EOM are normal. Pupils are equal, round, and reactive to light.  Cardiovascular: Normal rate and regular rhythm.   Pulmonary/Chest: Breath sounds normal. No respiratory distress. She has no wheezes. She has no rales.  Musculoskeletal: She exhibits no edema.    CBC    Component Value Date/Time   WBC 7.4 11/27/2012 1119   WBC 6.7 11/21/2012 0904   RBC 4.37 11/27/2012 1119   RBC 4.02 11/21/2012 0904   HGB 13.5 11/27/2012 1119   HCT 40.6 11/27/2012 1119   PLT 286 11/27/2012 1119   MCV 93 11/27/2012 1119   LYMPHSABS 2.9 11/27/2012 1119   LYMPHSABS 2.5 11/21/2012 0904   MONOABS 0.6 11/21/2012 0904   EOSABS 0.0 11/27/2012 1119   EOSABS 0.1 11/21/2012 0904   BASOSABS 0.0 11/27/2012 1119   BASOSABS 0.0 11/21/2012 0904    CMP     Component Value Date/Time   NA 140 11/27/2012 1119   NA 138 11/21/2012 0904   K 4.1 11/27/2012 1119   CL 102 11/27/2012 1119   CO2 22 11/27/2012 1119   GLUCOSE 75 11/27/2012 1119   GLUCOSE 91 11/21/2012 0904   BUN 11 11/27/2012 1119   BUN 10 11/21/2012 0904   CREATININE 0.65 11/27/2012 1119   CREATININE 0.69 11/21/2012 0904   CALCIUM 9.6 11/27/2012 1119  PROT 7.0 11/27/2012 1119   PROT 6.1 11/21/2012 0904   ALBUMIN 3.8 11/21/2012 0904   AST 21 11/27/2012 1119   ALT 13 11/27/2012 1119   ALKPHOS 62 11/27/2012 1119   BILITOT 0.3 11/27/2012 1119    GFRNONAA 108 11/27/2012 1119   GFRNONAA >89 11/21/2012 0904   GFRAA 124 11/27/2012 1119   GFRAA >89 11/21/2012 0904    No results found for this basename: chol, tri, ldl    No components found with this basename: hga1c    Lab Results  Component Value Date/Time   AST 21 11/27/2012 11:19 AM    Assessment and Plan  Unspecified asthma(493.90) - Plan: Symptoms are stable continue with albuterol (PROVENTIL HFA;VENTOLIN HFA) 108 (90 BASE) MCG/ACT inhaler, fluticasone (FLOVENT HFA) 110 MCG/ACT inhaler  Gastroesophageal reflux disease, esophagitis presence not specified - Plan: Advised for lifestyle modification, continue with omeprazole (PRILOSEC) 40 MG capsule  Smoking Advised patient to quit smoking.  Health Maintenance  -Mammogram: due, patient is going to schedule    Return in about 3 months (around 12/21/2013) for asthma, gerd.  Lorayne Marek, MD

## 2013-11-13 ENCOUNTER — Telehealth: Payer: Self-pay

## 2013-11-13 NOTE — Telephone Encounter (Signed)
patient called stating she is wondering if she needs to have her IUD removed because [place] and it is causing her pain.

## 2013-11-13 NOTE — Telephone Encounter (Signed)
Contacted patient and encouraged her to call and make an appointment to discuss concerns with provider before having IUD removed.  Pt verbalizes understanding.

## 2013-11-25 ENCOUNTER — Encounter: Payer: Self-pay | Admitting: Internal Medicine

## 2013-11-28 ENCOUNTER — Telehealth: Payer: Self-pay | Admitting: Internal Medicine

## 2013-11-28 NOTE — Telephone Encounter (Signed)
Pt calling to request refill for naproxen (NAPROSYN) 500 MG tablet [[id number]]. Next f/u appt will be around 12/21/13.

## 2013-12-06 ENCOUNTER — Telehealth: Payer: Self-pay | Admitting: Emergency Medicine

## 2013-12-06 ENCOUNTER — Other Ambulatory Visit: Payer: Self-pay | Admitting: Emergency Medicine

## 2013-12-06 MED ORDER — NAPROXEN 500 MG PO TABS: 500.0000 mg | ORAL_TABLET | Freq: Two times a day (BID) | ORAL | Status: DC | PRN

## 2013-12-06 NOTE — Telephone Encounter (Signed)
Pt informed Naprosyn 500 mg tab reordered and sent to [facility] pharmacy

## 2013-12-16 ENCOUNTER — Encounter: Payer: Self-pay | Admitting: Internal Medicine

## 2013-12-16 ENCOUNTER — Ambulatory Visit: Payer: Medicaid Other | Attending: Internal Medicine | Admitting: Internal Medicine

## 2013-12-16 VITALS — BP 144/98 | HR 85 | Temp 98.0°F | Resp 16 | Wt 165.6 lb

## 2013-12-16 DIAGNOSIS — J45909 Unspecified asthma, uncomplicated: Secondary | ICD-10-CM | POA: Diagnosis not present

## 2013-12-16 DIAGNOSIS — J209 Acute bronchitis, unspecified: Secondary | ICD-10-CM | POA: Diagnosis not present

## 2013-12-16 DIAGNOSIS — Z7951 Long term (current) use of inhaled steroids: Secondary | ICD-10-CM | POA: Insufficient documentation

## 2013-12-16 DIAGNOSIS — F17200 Nicotine dependence, unspecified, uncomplicated: Secondary | ICD-10-CM

## 2013-12-16 DIAGNOSIS — F319 Bipolar disorder, unspecified: Secondary | ICD-10-CM | POA: Diagnosis not present

## 2013-12-16 DIAGNOSIS — F17210 Nicotine dependence, cigarettes, uncomplicated: Secondary | ICD-10-CM | POA: Insufficient documentation

## 2013-12-16 DIAGNOSIS — Z720 Tobacco use: Secondary | ICD-10-CM

## 2013-12-16 DIAGNOSIS — Z79899 Other long term (current) drug therapy: Secondary | ICD-10-CM | POA: Diagnosis not present

## 2013-12-16 DIAGNOSIS — R05 Cough: Secondary | ICD-10-CM

## 2013-12-16 DIAGNOSIS — Z791 Long term (current) use of non-steroidal anti-inflammatories (NSAID): Secondary | ICD-10-CM | POA: Insufficient documentation

## 2013-12-16 DIAGNOSIS — R059 Cough, unspecified: Secondary | ICD-10-CM

## 2013-12-16 MED ORDER — BENZONATATE 100 MG PO CAPS: 100.0000 mg | ORAL_CAPSULE | Freq: Three times a day (TID) | ORAL | Status: AC | PRN

## 2013-12-16 MED ORDER — AZITHROMYCIN 250 MG PO TABS: ORAL_TABLET | ORAL | Status: AC

## 2013-12-16 NOTE — Progress Notes (Signed)
MRN: [id number] Name: Mariah Meyer  Sex: female Age: 48 y.o. DOB: [birth year]  Allergies: Percocet; Risperidone and related; and Sulfa antibiotics  Chief Complaint  Patient presents with  . Cough    HPI: Patient is 48 y.o. female who history of asthma comes today reported to have initially nasal congestion postnasal drip and for the last few days she has been having productive cough yellowish to greenish in color she denies any fever chills, as per patient she is using her Flovent, denies any wheezing or shortness of breath, she still smoke cigarettes, I have again advised patient to quit smoking.  Past Medical History  Diagnosis Date  . Asthma   . Endometriosis   . Depression   . Fibroid   . TMJ (temporomandibular joint disorder)   . Genital herpes 2009  . Heart murmur   . Bipolar disorder, unspecified     Past Surgical History  Procedure Laterality Date  . Ovarian cyst surgery    . Tubal ligation  07/1992      Medication List       This list is accurate as of: 12/16/13 12:09 PM.  Always use your most recent med list.               albuterol 108 (90 BASE) MCG/ACT inhaler  Commonly known as:  PROVENTIL HFA;VENTOLIN HFA  Inhale 2 puffs into the lungs every 6 (six) hours as needed for wheezing.     ARIPiprazole 5 MG tablet  Commonly known as:  ABILIFY  Take 1 tablet (5 mg total) by mouth daily.     azithromycin 250 MG tablet  Commonly known as:  ZITHROMAX Z-PAK  Take as directed     benzonatate 100 MG capsule  Commonly known as:  TESSALON  Take 1 capsule (100 mg total) by mouth 3 (three) times daily as needed for cough.     cholecalciferol 1000 UNITS tablet  Commonly known as:  VITAMIN D  Take 1,000 Units by mouth daily.     etodolac 500 MG tablet  Commonly known as:  LODINE  Take 1 tablet (500 mg total) by mouth 2 (two) times daily.     ferrous sulfate dried 160 (50 FE) MG Tbcr SR tablet  Commonly known as:  SLOW FE  Take 1 tablet (160 mg total) by  mouth 2 (two) times daily with a meal.     fluticasone 110 MCG/ACT inhaler  Commonly known as:  FLOVENT HFA  Inhale 1 puff into the lungs 2 (two) times daily.     fluticasone 50 MCG/ACT nasal spray  Commonly known as:  FLONASE  Place 2 sprays into both nostrils daily.     gabapentin 300 MG capsule  Commonly known as:  NEURONTIN  Take 1 capsule (300 mg total) by mouth 3 (three) times daily.     ipratropium 0.06 % nasal spray  Commonly known as:  ATROVENT  Place 2 sprays into both nostrils 4 (four) times daily.     LORazepam 1 MG tablet  Commonly known as:  ATIVAN  Take 1 tablet (1 mg total) by mouth 2 (two) times daily as needed for anxiety.     naproxen 500 MG tablet  Commonly known as:  NAPROSYN  Take 1 tablet (500 mg total) by mouth 2 (two) times daily as needed (pain).     nicotine 21 mg/24hr patch  Commonly known as:  NICODERM CQ  Place 1 patch (21 mg total) onto the skin daily.  omeprazole 40 MG capsule  Commonly known as:  PRILOSEC  Take 1 capsule (40 mg total) by mouth daily.     pregabalin 50 MG capsule  Commonly known as:  LYRICA  Take 1 capsule (50 mg total) by mouth 3 (three) times daily.     tiZANidine 4 MG capsule  Commonly known as:  ZANAFLEX  Take 4 mg by mouth 3 (three) times daily as needed for muscle spasms.     triamcinolone cream 0.1 %  Commonly known as:  KENALOG  Apply topically 2 (two) times daily.        Meds ordered this encounter  Medications  . azithromycin (ZITHROMAX Z-PAK) 250 MG tablet    Sig: Take as directed    Dispense:  6 each    Refill:  0  . benzonatate (TESSALON) 100 MG capsule    Sig: Take 1 capsule (100 mg total) by mouth 3 (three) times daily as needed for cough.    Dispense:  30 capsule    Refill:  1     There is no immunization history on file for this patient.  Family History  Problem Relation Age of Onset  . Alcoholism Mother   . Arthritis    . Asthma Mother   . Cancer    . Chronic bronchitis    .  Diabetes    . Emphysema    . HIV Mother   . Depression Mother   . Migraines    . Breast cancer Maternal Aunt     History  Substance Use Topics  . Smoking status: Current Every Day Smoker -- 0.50 packs/day    Types: Cigarettes  . Smokeless tobacco: Never Used  . Alcohol Use: 0.5 oz/week    1 drink(s) per week     Comment: occasionally     Review of Systems   As noted in HPI  Filed Vitals:   12/16/13 1131  BP: 144/98  Pulse: 85  Temp: 98 F (36.7 C)  Resp: 16    Physical Exam  Physical Exam  HENT:  Minimal nasal congestion no sinus tenderness   Eyes: EOM are normal. Pupils are equal, round, and reactive to light.  Cardiovascular: Normal rate and regular rhythm.   Pulmonary/Chest: Breath sounds normal. She has no wheezes. She has no rales.  Musculoskeletal: She exhibits no edema.    CBC    Component Value Date/Time   WBC 7.4 11/27/2012 1119   WBC 6.7 11/21/2012 0904   RBC 4.37 11/27/2012 1119   RBC 4.02 11/21/2012 0904   HGB 13.5 11/27/2012 1119   HCT 40.6 11/27/2012 1119   PLT 286 11/27/2012 1119   MCV 93 11/27/2012 1119   LYMPHSABS 2.9 11/27/2012 1119   LYMPHSABS 2.5 11/21/2012 0904   MONOABS 0.6 11/21/2012 0904   EOSABS 0.0 11/27/2012 1119   EOSABS 0.1 11/21/2012 0904   BASOSABS 0.0 11/27/2012 1119   BASOSABS 0.0 11/21/2012 0904    CMP     Component Value Date/Time   NA 140 11/27/2012 1119   NA 138 11/21/2012 0904   K 4.1 11/27/2012 1119   CL 102 11/27/2012 1119   CO2 22 11/27/2012 1119   GLUCOSE 75 11/27/2012 1119   GLUCOSE 91 11/21/2012 0904   BUN 11 11/27/2012 1119   BUN 10 11/21/2012 0904   CREATININE 0.65 11/27/2012 1119   CREATININE 0.69 11/21/2012 0904   CALCIUM 9.6 11/27/2012 1119   PROT 7.0 11/27/2012 1119   PROT 6.1 11/21/2012 0904   ALBUMIN  3.8 11/21/2012 0904   AST 21 11/27/2012 1119   ALT 13 11/27/2012 1119   ALKPHOS 62 11/27/2012 1119   BILITOT 0.3 11/27/2012 1119   GFRNONAA 108 11/27/2012 1119   GFRNONAA >89  11/21/2012 0904   GFRAA 124 11/27/2012 1119   GFRAA >89 11/21/2012 0904    No results found for: CHOL  No components found for: HGA1C  Lab Results  Component Value Date/Time   AST 21 11/27/2012 11:19 AM    Assessment and Plan  Acute bronchitis, unspecified organism - Plan: azithromycin (ZITHROMAX Z-PAK) 250 MG tablet, continue with albuterol and Flovent as prescribed  Cough - Plan: benzonatate (TESSALON) 100 MG capsule, also advise patient for saltwater gargles.  Smoking Advise patient to quit smoking.    Return if symptoms worsen or fail to improve.  Lorayne Marek, MD

## 2013-12-16 NOTE — Progress Notes (Signed)
Patient complains of pain under her ribs on the left side Coughing and sniffling for the past week Coughing up thick green mucous

## 2014-02-19 ENCOUNTER — Other Ambulatory Visit: Payer: Self-pay | Admitting: Neurology

## 2014-02-19 ENCOUNTER — Other Ambulatory Visit: Payer: Self-pay | Admitting: Internal Medicine

## 2014-02-19 NOTE — Telephone Encounter (Signed)
I spoke with the patient who said the pharmacy has contacted the wrong office.  Says she gets this med from her PCP. Asked that we disregard this request.

## 2014-02-21 ENCOUNTER — Telehealth: Payer: Self-pay | Admitting: Internal Medicine

## 2014-02-21 ENCOUNTER — Other Ambulatory Visit: Payer: Self-pay | Admitting: *Deleted

## 2014-02-21 MED ORDER — GABAPENTIN 300 MG PO CAPS: 300.0000 mg | ORAL_CAPSULE | Freq: Three times a day (TID) | ORAL | Status: AC

## 2014-02-21 MED ORDER — NAPROXEN 500 MG PO TABS: 500.0000 mg | ORAL_TABLET | Freq: Two times a day (BID) | ORAL | Status: AC | PRN

## 2014-02-21 NOTE — Telephone Encounter (Signed)
Patient can be given refill on the medications  

## 2014-02-21 NOTE — Telephone Encounter (Signed)
Rx send to [facility]

## 2014-02-21 NOTE — Telephone Encounter (Signed)
Pt has physical appt scheduled on 03/03/14 but is out of naproxen (NAPROSYN) 500 MG tablet and gabapentin (NEURONTIN) 300 MG capsule and is requesting refill be sent to [facility]. Please f/u with pt.

## 2014-02-24 ENCOUNTER — Telehealth: Payer: Self-pay | Admitting: Internal Medicine

## 2014-02-24 NOTE — Telephone Encounter (Signed)
Patient has called in today to request a medication refill for gabapentin (NEURONTIN) 300 MG capsule & naproxen (NAPROSYN) 500 MG tablet ; please f/u with patient

## 2014-02-27 ENCOUNTER — Telehealth: Payer: Self-pay

## 2014-02-28 NOTE — Telephone Encounter (Signed)
Patient called today requesting the medications mentioned in this note. Please follow up with pt.

## 2014-03-03 ENCOUNTER — Encounter: Payer: Medicaid Other | Admitting: Internal Medicine

## 2014-03-10 ENCOUNTER — Encounter: Payer: Medicaid Other | Admitting: Internal Medicine

## 2015-01-20 NOTE — Telephone Encounter (Signed)
error 

## 2015-04-07 IMAGING — CR DG RIBS W/ CHEST 3+V*R*
3 series · 3 of 3 positions shown · non-contrast
Comparison: None.

CLINICAL DATA: Right lateral rib pain

EXAM:
RIGHT RIBS AND CHEST - 3+ VIEW

[w chest pa]
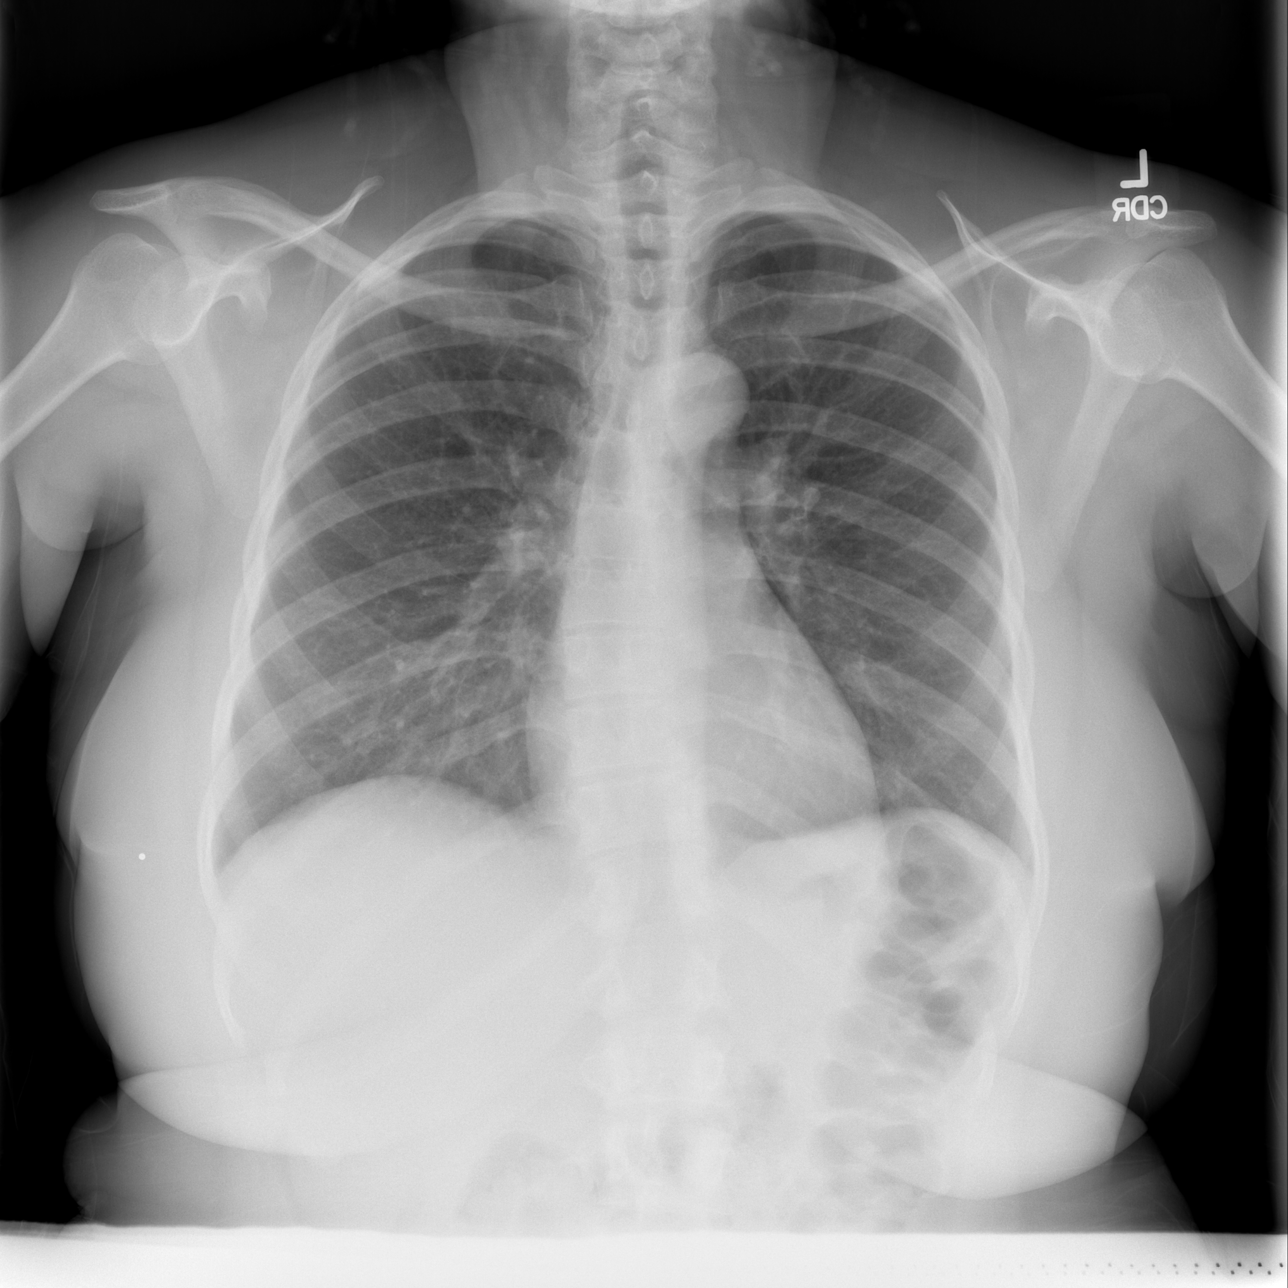

[w ribs ap/pa upper right]
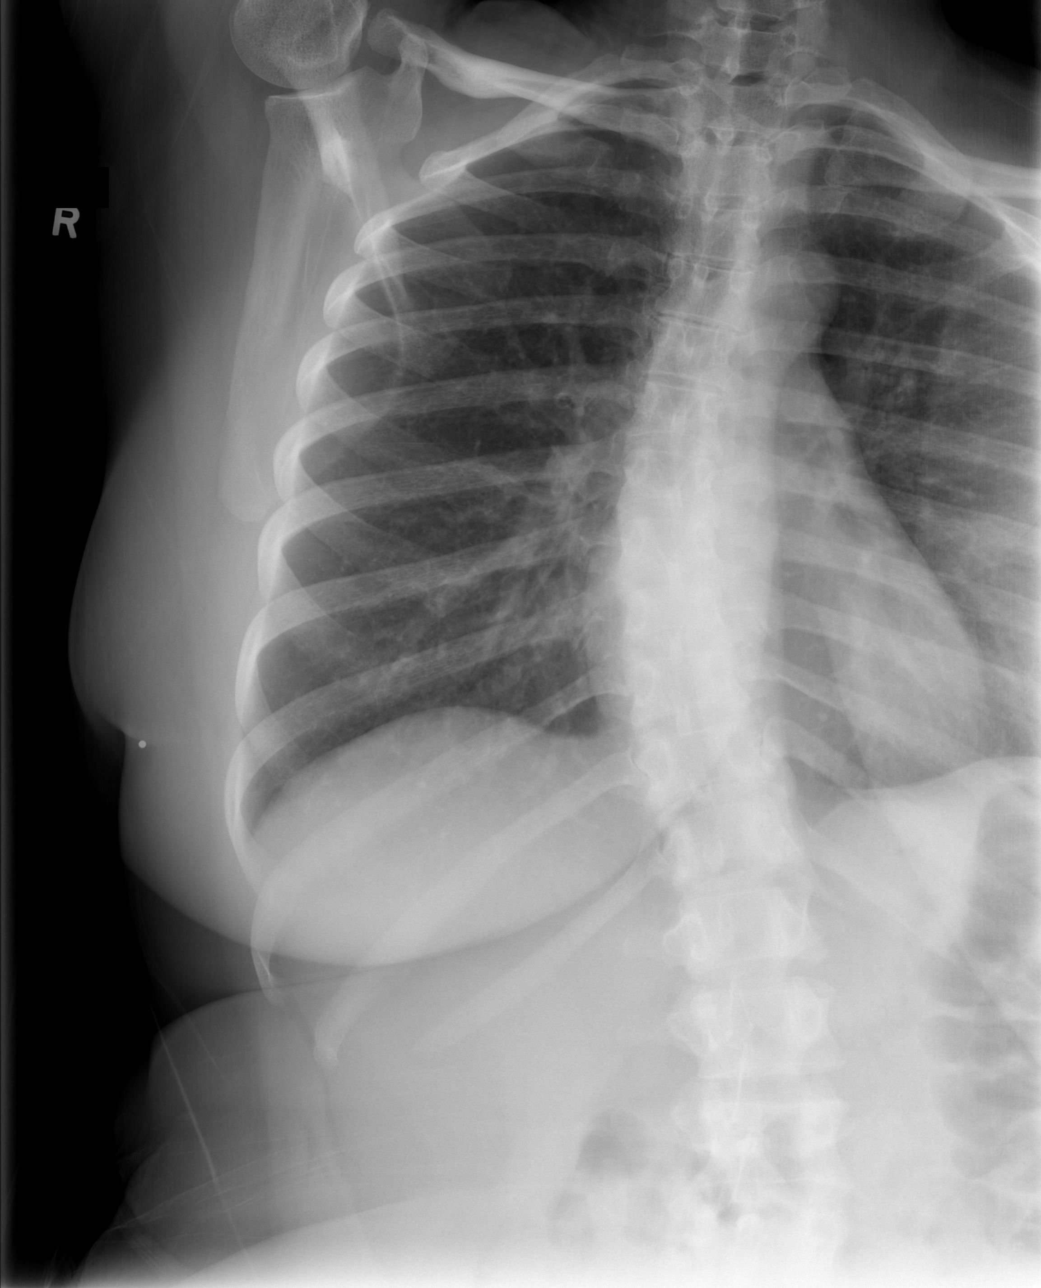

[w ribs oblique right]
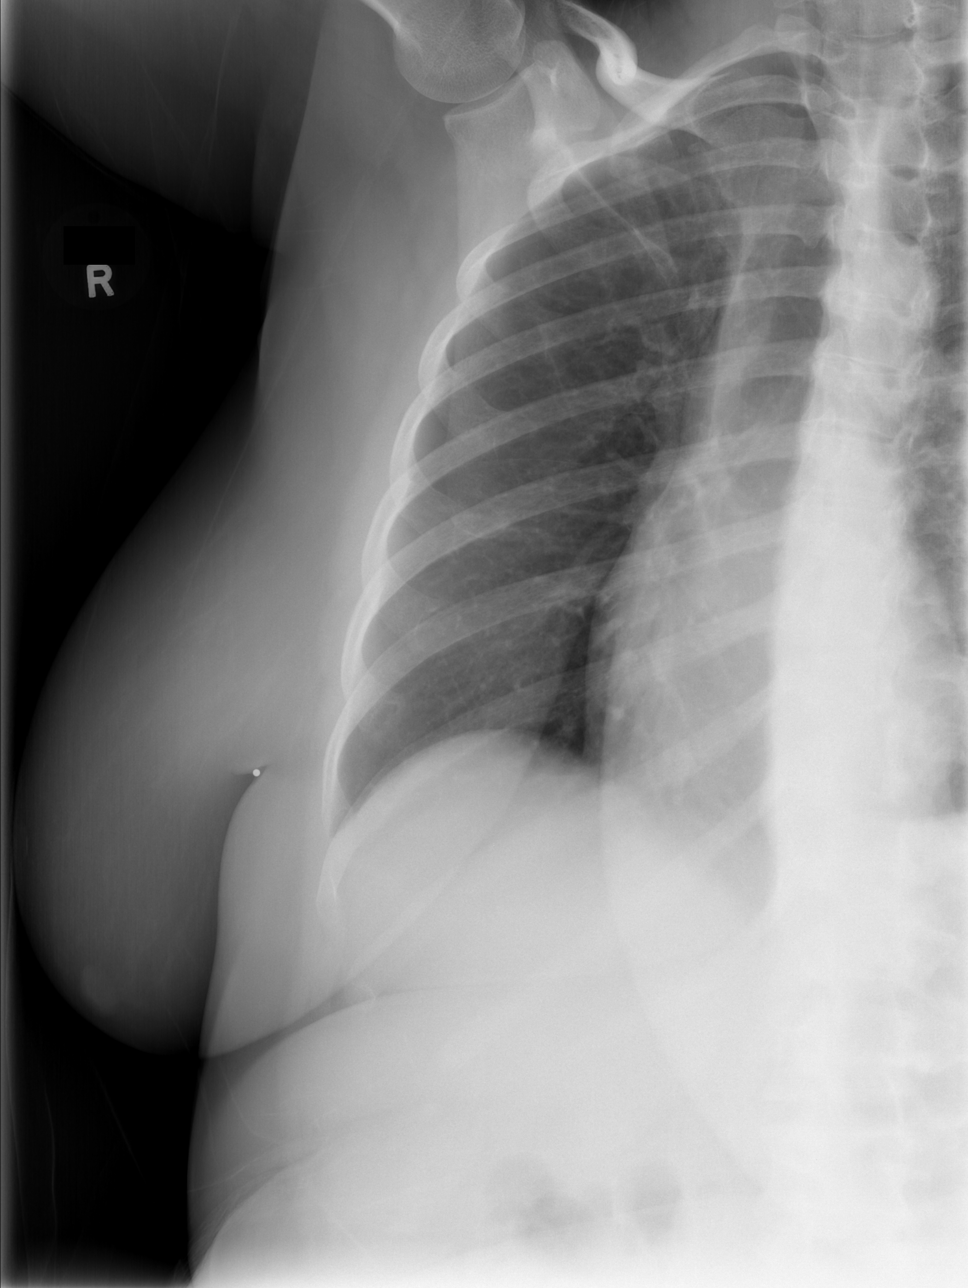

[3 of 3 positions shown; findings below may reference images not displayed]

FINDINGS: Three views right ribs submitted. No right rib fracture is
identified. No acute infiltrate or pulmonary edema. Mild thoracic
dextroscoliosis. No diagnostic pneumothorax.
IMPRESSION: Negative.  Mild thoracic dextroscoliosis.
